# Patient Record
Sex: Male | Born: 1980 | Race: White | Hispanic: No | Marital: Single | Smoking: Never smoker
Health system: Southern US, Community
[De-identification: ages and names within clinical notes are randomized; demographics above are authoritative.]

## PROBLEM LIST (undated history)

## (undated) DIAGNOSIS — R161 Splenomegaly, not elsewhere classified: Secondary | ICD-10-CM

## (undated) DIAGNOSIS — I219 Acute myocardial infarction, unspecified: Secondary | ICD-10-CM

## (undated) DIAGNOSIS — E119 Type 2 diabetes mellitus without complications: Secondary | ICD-10-CM

## (undated) DIAGNOSIS — I2510 Atherosclerotic heart disease of native coronary artery without angina pectoris: Secondary | ICD-10-CM

## (undated) DIAGNOSIS — I10 Essential (primary) hypertension: Secondary | ICD-10-CM

## (undated) DIAGNOSIS — K760 Fatty (change of) liver, not elsewhere classified: Secondary | ICD-10-CM

## (undated) DIAGNOSIS — I700 Atherosclerosis of aorta: Secondary | ICD-10-CM

## (undated) HISTORY — DX: Splenomegaly, not elsewhere classified: R16.1

## (undated) HISTORY — PX: CARDIAC SURGERY: SHX584

## (undated) HISTORY — DX: Atherosclerosis of aorta: I70.0

## (undated) HISTORY — DX: Acute myocardial infarction, unspecified: I21.9

## (undated) HISTORY — DX: Fatty (change of) liver, not elsewhere classified: K76.0

---

## 2011-02-04 ENCOUNTER — Other Ambulatory Visit: Payer: Self-pay | Admitting: Family Medicine

## 2011-02-04 DIAGNOSIS — N50819 Testicular pain, unspecified: Secondary | ICD-10-CM

## 2011-02-05 ENCOUNTER — Ambulatory Visit (HOSPITAL_COMMUNITY)
Admission: RE | Admit: 2011-02-05 | Discharge: 2011-02-05 | Disposition: A | Discharge status: Home / Self Care | Payer: Self-pay | Source: Ambulatory Visit | Attending: Family Medicine | Admitting: Family Medicine

## 2011-02-05 DIAGNOSIS — N509 Disorder of male genital organs, unspecified: Secondary | ICD-10-CM | POA: Insufficient documentation

## 2011-02-05 DIAGNOSIS — N50819 Testicular pain, unspecified: Secondary | ICD-10-CM

## 2012-10-04 IMAGING — US US SCROTUM
1 series · 14 of 25 positions shown · non-contrast
Comparison: None.

CLINICAL DATA: Pain at left testicle.  Question mass or cyst.

ULTRASOUND OF SCROTUM
TECHNIQUE: Complete ultrasound examination of the testicles,
epididymis, and other scrotal structures was performed.

[Series 1: us scrotum · 0.08mm/px · 14 of 49 slices shown]
[im 1/49]
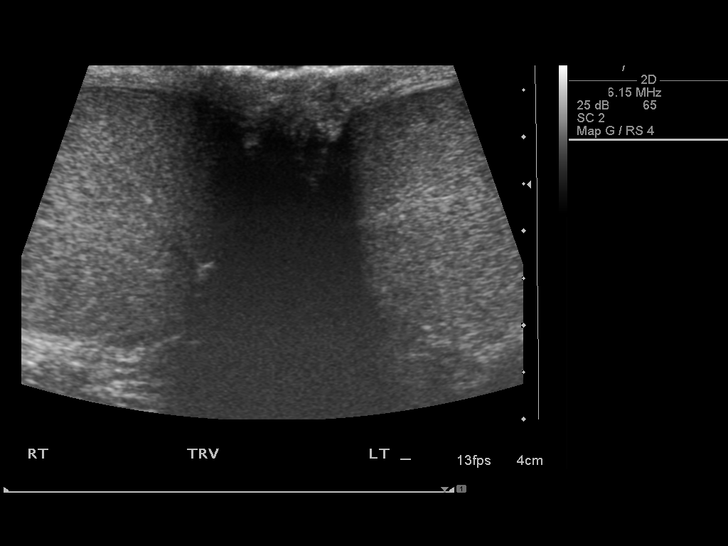
[im 5/49]
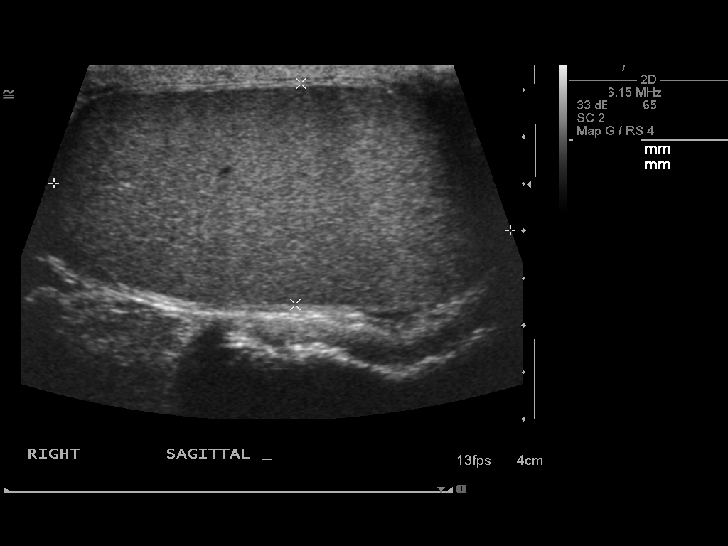
[im 9/49]
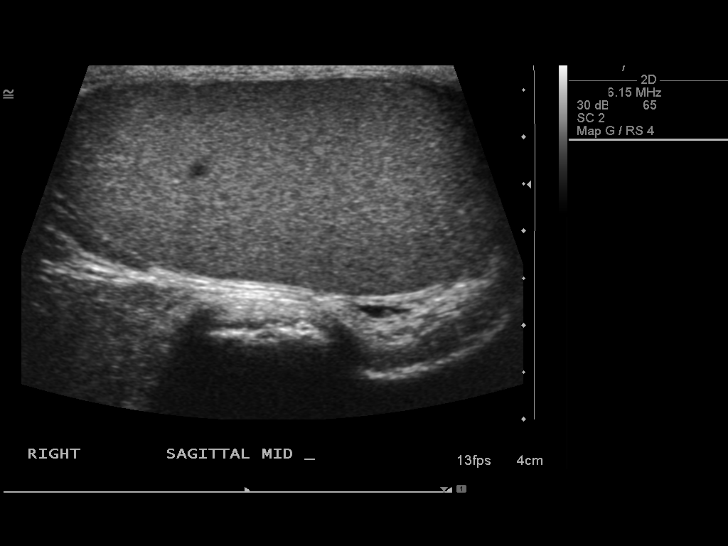
[im 13/49]
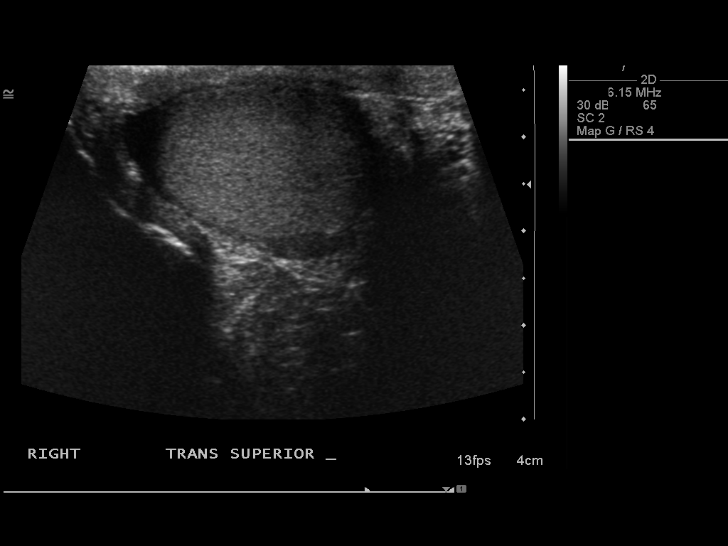
[im 17/49]
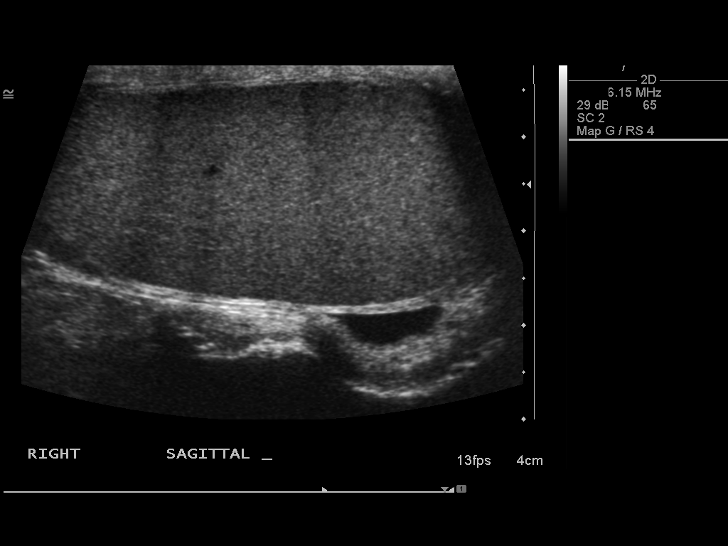
[im 19/49]
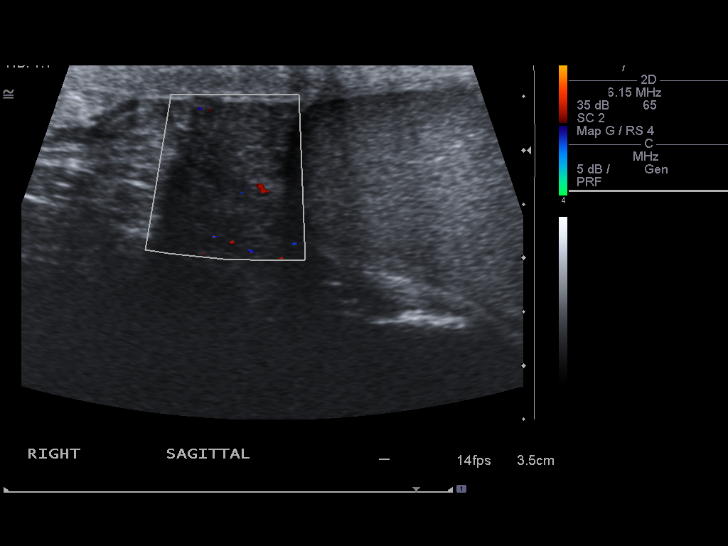
[im 23/49]
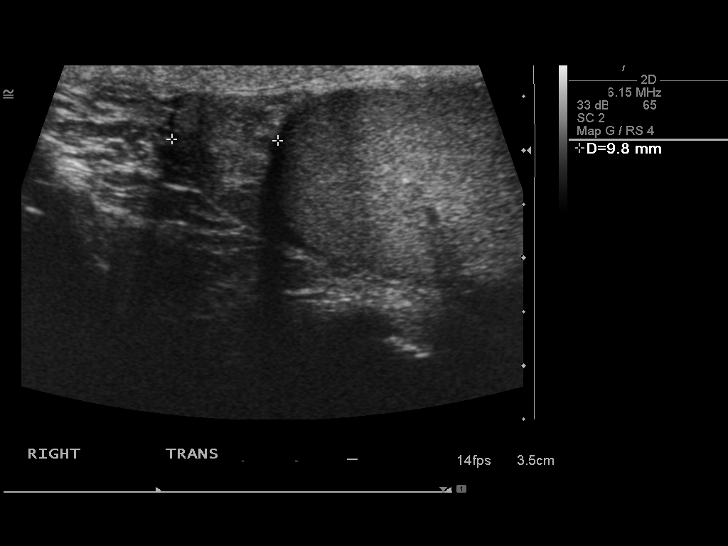
[im 27/49]
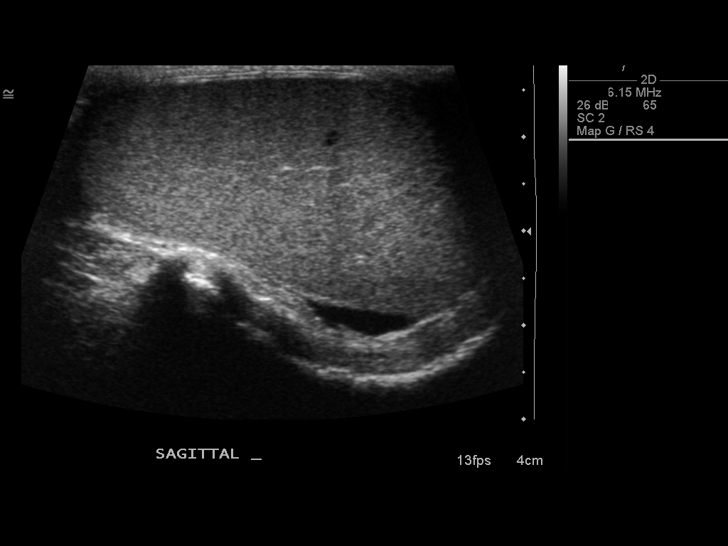
[im 31/49]
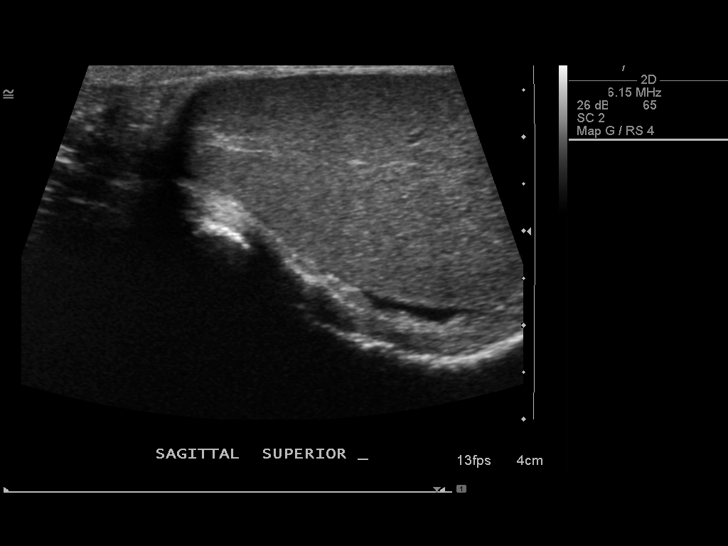
[im 33/49]
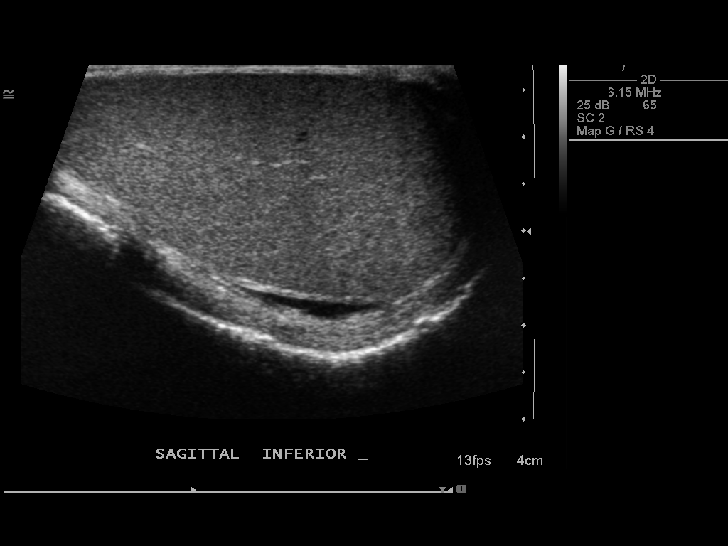
[im 37/49]
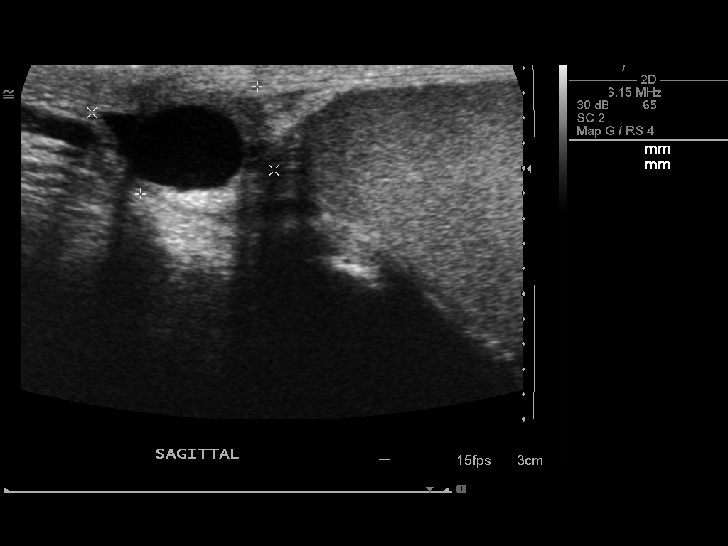
[im 41/49]
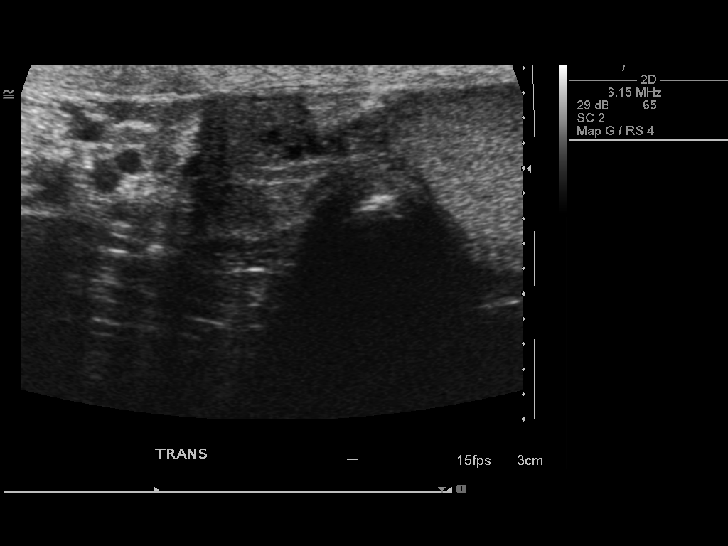
[im 45/49]
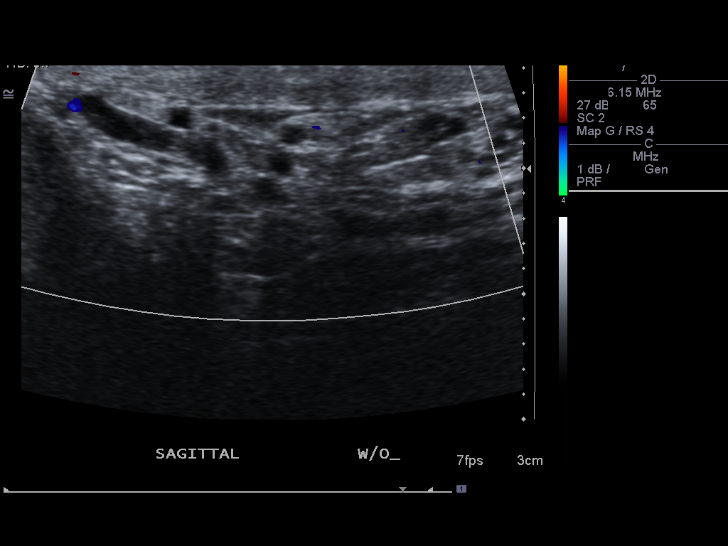
[im 49/49]
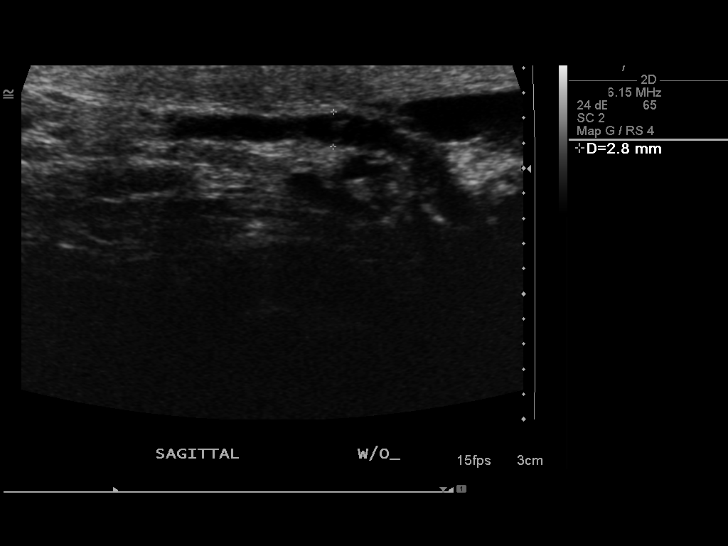

[14 of 25 positions shown; findings below may reference images not displayed]

FINDINGS: Right testis:  4.9 x 2.4 x 3.4 cm.  Normal in color signal.  Normal
in gray scale appearance.

Left testis:  4.7 x 2.4 x 1.9 cm.  Normal in gray scale and color
Doppler appearance.

Right epididymis:  Normal.

Left epididymis:  9 mm epididymal cyst versus spermatocele.

Hydrocele:  Absent.

Varicocele:  Equivocal.  Borderline enlarged left hemiscrotal veins
at up to 3 mm.
IMPRESSION: 1.  No evidence of testicular mass.
2.  Left epididymal cyst versus spermatocele.
3.  Borderline left sided varicocele.

## 2015-11-15 ENCOUNTER — Encounter (HOSPITAL_COMMUNITY): Payer: Self-pay | Admitting: Emergency Medicine

## 2015-11-15 ENCOUNTER — Emergency Department (HOSPITAL_COMMUNITY)
Admission: EM | Admit: 2015-11-15 | Discharge: 2015-11-15 | Disposition: A | Discharge status: Home / Self Care | Payer: Self-pay | Attending: Emergency Medicine | Admitting: Emergency Medicine

## 2015-11-15 DIAGNOSIS — K0889 Other specified disorders of teeth and supporting structures: Secondary | ICD-10-CM | POA: Insufficient documentation

## 2015-11-15 DIAGNOSIS — I10 Essential (primary) hypertension: Secondary | ICD-10-CM | POA: Insufficient documentation

## 2015-11-15 DIAGNOSIS — K029 Dental caries, unspecified: Secondary | ICD-10-CM | POA: Insufficient documentation

## 2015-11-15 HISTORY — DX: Essential (primary) hypertension: I10

## 2015-11-15 MED ORDER — PENICILLIN V POTASSIUM 250 MG PO TABS: 250.0000 mg | ORAL_TABLET | Freq: Four times a day (QID) | ORAL | Status: AC

## 2015-11-15 MED ORDER — OXYCODONE-ACETAMINOPHEN 5-325 MG PO TABS
1.0000 | ORAL_TABLET | Freq: Once | ORAL | Status: AC
  Administered 2015-11-15: 1 via ORAL

## 2015-11-15 MED ORDER — OXYCODONE-ACETAMINOPHEN 5-325 MG PO TABS
ORAL_TABLET | ORAL | Status: AC
  Filled 2015-11-15: qty 1

## 2015-11-15 NOTE — ED Notes (Signed)
Pt. reports left cheek pain with mild swelling onset this morning , denies injury , pt. stated broken tooth at left upper cheek . Hypertensive at triage .

## 2015-11-15 NOTE — Discharge Instructions (Signed)

## 2015-11-15 NOTE — ED Notes (Signed)
Patient able to ambulate independently  

## 2015-11-15 NOTE — ED Provider Notes (Signed)
CSN: [id number]     Arrival date & time 11/15/15  1946 History  By signing my name below, I, Placido SouLogan Joldersma, attest that this documentation has been prepared under the direction and in the presence of Roxy Horsemanobert Rickie Gutierres, PA-C. Electronically Signed: Placido SouLogan Joldersma, ED Scribe. 11/15/2015. 9:56 PM.    Chief Complaint  Patient presents with  . Facial Pain   The history is provided by the patient. No language interpreter was used.    HPI Comments: Joel Pierce is a 35 y.o. male who presents to the Emergency Department complaining of worsening, moderate, left upper dental pain onset this morning. He reports an associated, mild, point of swelling in the affected region. His pain worsens with palpation. He has an appointment with his dental provider in 8 days. He has no known drug allergies. Pt denies any other associated symptoms at this time.    Past Medical History  Diagnosis Date  . Hypertension    History reviewed. No pertinent past surgical history. No family history on file. Social History  Substance Use Topics  . Smoking status: Never Smoker   . Smokeless tobacco: None  . Alcohol Use: No    Review of Systems  Constitutional: Negative for fever and chills.  HENT: Positive for dental problem and facial swelling.     Allergies  Review of patient's allergies indicates no known allergies.  Home Medications   Prior to Admission medications   Not on File   BP 178/116 mmHg  Pulse 105  Temp(Src) 97.9 F (36.6 C) (Oral)  Resp 18  Ht 6' (1.829 m)  Wt 272 lb 8 oz (123.605 kg)  BMI 36.95 kg/m2  SpO2 97%    Physical Exam  Constitutional: Pt appears well-developed and well-nourished.  HENT:  Head: Normocephalic.  Right Ear: Tympanic membrane, external ear and ear canal normal.  Left Ear: Tympanic membrane, external ear and ear canal normal.  Nose: Nose normal. Right sinus exhibits no maxillary sinus tenderness and no frontal sinus tenderness. Left sinus exhibits no  maxillary sinus tenderness and no frontal sinus tenderness.  Mouth/Throat: Uvula is midline, oropharynx is clear and moist and mucous membranes are normal. No oral lesions. Abnormal dentition. Dental caries present. No uvula swelling or lacerations. No oropharyngeal exudate, posterior oropharyngeal edema, posterior oropharyngeal erythema or tonsillar abscesses. Aphthous ulcer is present . No gingival swelling, fluctuance or induration No gross abscess  Eyes: Conjunctivae are normal. Pupils are equal, round, and reactive to light. Right eye exhibits no discharge. Left eye exhibits no discharge.  Neck: Normal range of motion. Neck supple.  No stridor Handling secretions without difficulty No nuchal rigidity No cervical lymphadenopathy  Cardiovascular: Normal rate, regular rhythm and normal heart sounds.   Pulmonary/Chest: Effort normal. No respiratory distress.  Equal chest rise  Abdominal: Soft. Bowel sounds are normal. Pt exhibits no distension. There is no tenderness.  Lymphadenopathy:    Pt has no cervical adenopathy.  Neurological: Pt is alert.  Skin: Skin is warm and dry.  Psychiatric: Pt has a normal mood and affect.  Nursing note and vitals reviewed.   ED Course  Procedures  DIAGNOSTIC STUDIES: Oxygen Saturation is 97% on RA, normal by my interpretation.    COORDINATION OF CARE: 9:54 PM Discussed next steps with pt. He verbalized understanding and is agreeable with the plan.     MDM   Final diagnoses:  Pain, dental    Patient with dentalgia.  No abscess requiring immediate incision and drainage.  Exam not concerning for  Ludwig's angina or pharyngeal abscess.  Will treat with penicillin. Pt instructed to follow-up with dentist.  Discussed return precautions. Pt safe for discharge.   I personally performed the services described in this documentation, which was scribed in my presence. The recorded information has been reviewed and is accurate.      Roxy Horseman,  PA-C 11/15/15 2202  Linwood Dibbles, MD 11/16/15 [phone]

## 2015-11-15 NOTE — ED Notes (Signed)
PA at bedside.

## 2016-02-15 ENCOUNTER — Ambulatory Visit (HOSPITAL_COMMUNITY)
Admission: EM | Admit: 2016-02-15 | Discharge: 2016-02-15 | Disposition: A | Discharge status: Home / Self Care | Payer: Self-pay | Attending: Family Medicine | Admitting: Family Medicine

## 2016-02-15 ENCOUNTER — Encounter (HOSPITAL_COMMUNITY): Payer: Self-pay | Admitting: Nurse Practitioner

## 2016-02-15 DIAGNOSIS — L237 Allergic contact dermatitis due to plants, except food: Secondary | ICD-10-CM

## 2016-02-15 MED ORDER — METHYLPREDNISOLONE ACETATE 80 MG/ML IJ SUSP
INTRAMUSCULAR | Status: AC
  Filled 2016-02-15: qty 1

## 2016-02-15 MED ORDER — BETAMETHASONE DIPROPIONATE 0.05 % EX CREA: TOPICAL_CREAM | Freq: Two times a day (BID) | CUTANEOUS | Status: DC

## 2016-02-15 MED ORDER — METHYLPREDNISOLONE ACETATE 80 MG/ML IJ SUSP
80.0000 mg | Freq: Once | INTRAMUSCULAR | Status: AC
  Administered 2016-02-15: 80 mg via INTRAMUSCULAR

## 2016-02-15 NOTE — ED Notes (Signed)
Pt c/o 3 day history of itchy rash to bilateral arms. Onset after digging in dirt. He has tried several otc creams with no relief. He denies using any new products or medications. He is hypertensive today, he says that he does not take bp medication because of the side effects

## 2016-02-15 NOTE — ED Provider Notes (Signed)
CSN: [id number]     Arrival date & time 02/15/16  1259 History   First MD Initiated Contact with Patient 02/15/16 1344     Chief Complaint  Patient presents with  . Rash   (Consider location/radiation/quality/duration/timing/severity/associated sxs/prior Treatment) Patient is a 35 y.o. male presenting with rash. The history is provided by the patient.  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 Relieved by:  None tried Associated symptoms: no fever, no shortness of breath and not wheezing     Past Medical History  Diagnosis Date  . Hypertension    History reviewed. No pertinent past surgical history. History reviewed. No pertinent family history. Social History  Substance Use Topics  . Smoking status: Never Smoker   . Smokeless tobacco: None  . Alcohol Use: No    Review of Systems  Constitutional: Negative for fever.  Respiratory: Negative for shortness of breath and wheezing.   Musculoskeletal: Negative.   Skin: Positive for rash.  All other systems reviewed and are negative.   Allergies  Review of patient's allergies indicates no known allergies.  Home Medications   Prior to Admission medications   Medication Sig Start Date End Date Taking? Authorizing Provider  betamethasone dipropionate (DIPROLENE) 0.05 % cream Apply topically 2 (two) times daily. Spar[place] 02/15/16   Linna HoffJames D Abhishek Levesque, MD   Meds Ordered and Administered this Visit   Medications  methylPREDNISolone acetate (DEPO-MEDROL) injection 80 mg (not administered)    BP 176/129 mmHg  Pulse 80  Temp(Src) 98.5 F (36.9 C) (Oral)  Resp 20  SpO2 98% No data found.   Physical Exam  Constitutional: He appears well-developed and well-nourished.  No distress.  Skin: Skin is warm and dry. Rash noted.  Streaky patchy irreg p/v erythematous rash on volar forearms bilat.  Nursing note and vitals reviewed.   ED Course  Procedures (including critical care time)  Labs Review Labs Reviewed - No data to display  Imaging Review No results found.   Visual Acuity Review  Right Eye Distance:   Left Eye Distance:   Bilateral Distance:    Right Eye Near:   Left Eye Near:    Bilateral Near:         MDM   1. Contact dermatitis due to poison ivy        Linna HoffJames D Cochise Dinneen, MD 02/15/16 1409

## 2017-01-26 ENCOUNTER — Encounter (HOSPITAL_COMMUNITY): Payer: Self-pay | Admitting: Emergency Medicine

## 2017-01-26 ENCOUNTER — Ambulatory Visit (HOSPITAL_COMMUNITY)
Admission: EM | Admit: 2017-01-26 | Discharge: 2017-01-26 | Disposition: A | Discharge status: Home / Self Care | Payer: BLUE CROSS/BLUE SHIELD | Attending: Family Medicine | Admitting: Family Medicine

## 2017-01-26 DIAGNOSIS — H66012 Acute suppurative otitis media with spontaneous rupture of ear drum, left ear: Secondary | ICD-10-CM

## 2017-01-26 DIAGNOSIS — R05 Cough: Secondary | ICD-10-CM | POA: Diagnosis not present

## 2017-01-26 DIAGNOSIS — R059 Cough, unspecified: Secondary | ICD-10-CM

## 2017-01-26 MED ORDER — AMOXICILLIN 500 MG PO CAPS: 500.0000 mg | ORAL_CAPSULE | Freq: Three times a day (TID) | ORAL | 0 refills | Status: DC

## 2017-01-26 NOTE — ED Triage Notes (Signed)
[facility] and children with a complaint of a sore throat, cough and left ear pain.

## 2017-01-26 NOTE — ED Provider Notes (Signed)
CSN: [id number]     Arrival date & time 01/26/17  1336 History   First MD Initiated Contact with Patient 01/26/17 [phone]     Chief Complaint  Patient presents with  . Cough  . Sore Throat  . Otalgia   (Consider location/radiation/quality/duration/timing/severity/associated sxs/prior Treatment) Father states that he has had lt ear pain for the past 3 days . Put hydrogen peroxide in ear then heard a "pop" and the pain went away he just still feels the fluid like sound. Denies any n/v/d. Does have an intetrmit cough. Has not taken anything else.       Past Medical History:  Diagnosis Date  . Hypertension    History reviewed. No pertinent surgical history. History reviewed. No pertinent family history. Social History  Substance Use Topics  . Smoking status: Never Smoker  . Smokeless tobacco: Not on file  . Alcohol use No    Review of Systems  Constitutional: Negative.   HENT: Positive for ear pain and sore throat.   Eyes: Negative.   Respiratory: Positive for cough.   Cardiovascular: Negative.   Gastrointestinal: Negative.   Musculoskeletal: Negative.   Neurological: Negative.     Allergies  Patient has no known allergies.  Home Medications   Prior to Admission medications   Medication Sig Start Date End Date Taking? Authorizing Provider  amoxicillin (AMOXIL) 500 MG capsule Take 1 capsule (500 mg total) by mouth 3 (three) times daily. 01/26/17   Tobi BastosMitchell, Courtenay Creger A, NP   Meds Ordered and Administered this Visit  Medications - No data to display  BP (!) 117/112 (BP Location: Right Arm) Comment: cma notified  Pulse (!) 101 Comment: notified cma  Temp 98.9 F (37.2 C) (Oral)   Resp 16   SpO2 97%  No data found.   Physical Exam  Constitutional: He appears well-developed.  HENT:  Head: Normocephalic.  Right Ear: External ear normal.  Nose: Nose normal.  Mouth/Throat: Oropharynx is clear and moist.  LT TM ruptured, erythema,   Eyes: Pupils are equal, round, and  reactive to light.  Neck: Normal range of motion.  Cardiovascular: Normal rate and regular rhythm.   Pulmonary/Chest: Effort normal and breath sounds normal.  Abdominal: Soft. Bowel sounds are normal.  Musculoskeletal: Normal range of motion.  Neurological: He is alert.  Skin: Skin is warm.    Urgent Care Course     Procedures (including critical care time)  Labs Review Labs Reviewed - No data to display  Imaging Review No results found.           MDM   1. Cough   2. Acute suppurative otitis media of left ear with spontaneous rupture of tympanic membrane, recurrence not specified    Do not put anything in ears Take full dose of abx with food May use a humidifier at night to help, cough may linger for several weeks after treatment. May take tylenol or motrin as needed for pain      Tobi BastosMitchell, Essense Bousquet A, NP 01/26/17 1515

## 2019-07-19 ENCOUNTER — Other Ambulatory Visit: Payer: Self-pay

## 2019-07-19 DIAGNOSIS — Z20822 Contact with and (suspected) exposure to covid-19: Secondary | ICD-10-CM

## 2019-07-21 ENCOUNTER — Telehealth: Payer: Self-pay | Admitting: General Practice

## 2019-07-21 LAB — NOVEL CORONAVIRUS, NAA: SARS-CoV-2, NAA: NOT DETECTED

## 2019-07-21 NOTE — Telephone Encounter (Signed)
Patient is calling for his negative COVID results. Patient expressed understanding.

## 2021-09-01 ENCOUNTER — Ambulatory Visit
Admission: EM | Admit: 2021-09-01 | Discharge: 2021-09-01 | Disposition: A | Discharge status: Home / Self Care | Payer: 59

## 2021-09-01 ENCOUNTER — Other Ambulatory Visit: Payer: Self-pay

## 2021-09-01 DIAGNOSIS — J0110 Acute frontal sinusitis, unspecified: Secondary | ICD-10-CM

## 2021-09-01 MED ORDER — AMOXICILLIN-POT CLAVULANATE 875-125 MG PO TABS: 1.0000 | ORAL_TABLET | Freq: Two times a day (BID) | ORAL | 0 refills | Status: DC

## 2021-09-01 MED ORDER — ACETAMINOPHEN 325 MG PO TABS: 975.0000 mg | ORAL_TABLET | Freq: Once | ORAL | Status: DC

## 2021-09-01 MED ORDER — ACETAMINOPHEN 325 MG PO TABS
650.0000 mg | ORAL_TABLET | Freq: Once | ORAL | Status: AC
  Administered 2021-09-01: 650 mg via ORAL

## 2021-09-01 NOTE — Discharge Instructions (Addendum)
You have been prescribed antibiotic for upper respiratory infection.  Please monitor blood pressure at home and follow-up with primary care if it continues to be elevated.

## 2021-09-01 NOTE — ED Provider Notes (Signed)
[facility]    CSN: [id number] Arrival date & time: 09/01/21  1145      History   Chief Complaint Chief Complaint  Patient presents with   Facial Pain    HPI Joel Pierce is a 41 y.o. male.   Patient presents with headache, nasal congestion, sinus pressure that has been present for multiple days.  Patient reports that he has had the symptoms for over a week.  He also reports that his snot is bright yellow to green.  Denies any known fevers.  Denies chest pain, shortness of breath, sore throat, ear pain, nausea, vomiting, diarrhea, abdominal pain.  Patient has not taken any medications to help alleviate his symptoms.  He also has an elevated blood pressure today.  He reports that he does take blood pressure medication but skipped the dose yesterday because he would be drinking alcohol.  Denies chest pain, shortness of breath, blurry vision, dizziness, nausea, vomiting.  He does have a headache that he is attributing to his sinus pressure.    Past Medical History:  Diagnosis Date   Hypertension     There are no problems to display for this patient.   Past Surgical History:  Procedure Laterality Date   CARDIAC SURGERY         Home Medications    Prior to Admission medications   Medication Sig Start Date End Date Taking? Authorizing Provider  amoxicillin-clavulanate (AUGMENTIN) 875-125 MG tablet Take 1 tablet by mouth every 12 (twelve) hours. 09/01/21  Yes Gustavus Bryant, FNP  aspirin 81 MG chewable tablet Chew 1 tablet by mouth daily. 08/08/19  Yes [provider]  carvedilol (COREG) 12.5 MG tablet Take by mouth. 05/25/20  Yes [provider]  losartan (COZAAR) 100 MG tablet Take 1 tablet by mouth daily. 11/23/20  Yes [provider]  metFORMIN (GLUCOPHAGE-XR) 500 MG 24 hr tablet Take 2 tablets by mouth daily with breakfast. 08/27/20  Yes [provider]  finasteride (PROSCAR) 5 MG tablet Take by mouth.    [provider]    Family History History reviewed. No pertinent family history.  Social History Social History   Tobacco Use   Smoking status: Never  Vaping Use   Vaping Use: Every day   Substances: Nicotine, Flavoring  Substance Use Topics   Alcohol use: No   Drug use: Yes    Types: Marijuana    Comment: daily     Allergies   Patient has no known allergies.   Review of Systems Review of Systems Per HPI  Physical Exam Triage Vital Signs ED Triage Vitals  Enc Vitals Group     BP 09/01/21 1250 (!) 163/103     Pulse Rate 09/01/21 1250 84     Resp 09/01/21 1250 18     Temp 09/01/21 1250 97.9 F (36.6 C)     Temp Source 09/01/21 1250 Oral     SpO2 09/01/21 1250 97 %     Weight 09/01/21 1247 250 lb (113.4 kg)     Height 09/01/21 1247 5\' 10"  (1.778 m)     Head Circumference --      Peak Flow --      Pain Score 09/01/21 1247 6     Pain Loc --      Pain Edu? --      Excl. in GC? --    No data found.  Updated Vital Signs BP (!) 155/100    Pulse 84  Temp 97.9 F (36.6 C) (Oral)    Resp 18    Ht 5\' 10"  (1.778 m)    Wt 250 lb (113.4 kg)    SpO2 97%    BMI 35.87 kg/m   Visual Acuity Right Eye Distance:   Left Eye Distance:   Bilateral Distance:    Right Eye Near:   Left Eye Near:    Bilateral Near:     Physical Exam Constitutional:      General: He is not in acute distress.    Appearance: Normal appearance. He is not toxic-appearing or diaphoretic.  HENT:     Head: Normocephalic and atraumatic.     Right Ear: Tympanic membrane and ear canal normal.     Left Ear: Tympanic membrane and ear canal normal.     Nose: Congestion present.     Right Sinus: Frontal sinus tenderness present. No maxillary sinus tenderness.     Left Sinus: Frontal sinus tenderness present. No maxillary sinus tenderness.     Mouth/Throat:     Mouth: Mucous membranes are moist.     Pharynx: No posterior oropharyngeal erythema.  Eyes:     Extraocular Movements: Extraocular  movements intact.     Conjunctiva/sclera: Conjunctivae normal.     Pupils: Pupils are equal, round, and reactive to light.  Cardiovascular:     Rate and Rhythm: Normal rate and regular rhythm.     Pulses: Normal pulses.     Heart sounds: Normal heart sounds.  Pulmonary:     Effort: Pulmonary effort is normal. No respiratory distress.     Breath sounds: Normal breath sounds. No stridor. No wheezing, rhonchi or rales.  Abdominal:     General: Abdomen is flat. Bowel sounds are normal.     Palpations: Abdomen is soft.  Musculoskeletal:        General: Normal range of motion.     Cervical back: Normal range of motion.  Skin:    General: Skin is warm and dry.  Neurological:     General: No focal deficit present.     Mental Status: He is alert and oriented to person, place, and time. Mental status is at baseline.     Cranial Nerves: Cranial nerves 2-12 are intact.     Sensory: Sensation is intact.     Motor: Motor function is intact.     Coordination: Coordination is intact.     Gait: Gait is intact.  Psychiatric:        Mood and Affect: Mood normal.        Behavior: Behavior normal.     UC Treatments / Results  Labs (all labs ordered are listed, but only abnormal results are displayed) Labs Reviewed - No data to display  EKG   Radiology No results found.  Procedures Procedures (including critical care time)  Medications Ordered in UC Medications  acetaminophen (TYLENOL) tablet 975 mg (0 mg Oral Not Given 09/01/21 1255)  acetaminophen (TYLENOL) tablet 650 mg (650 mg Oral Given 09/01/21 1255)    Initial Impression / Assessment and Plan / UC Course  I have reviewed the triage vital signs and the nursing notes.  Pertinent labs & imaging results that were available during my care of the patient were reviewed by me and considered in my medical decision making (see chart for details).     Patient's physical exam appears consistent with acute sinusitis.  Will treat with  Augmentin antibiotic.  Do not think that imaging is necessary at this time.  Do not think that viral testing is necessary given duration of symptoms.  Patient also advised of symptomatic treatment.  Discussed strict return precautions.  Patient verbalized understanding and was agreeable with plan. Final Clinical Impressions(s) / UC Diagnoses   Final diagnoses:  Acute non-recurrent frontal sinusitis     Discharge Instructions      You have been prescribed antibiotic for upper respiratory infection.  Please monitor blood pressure at home and follow-up with primary care if it continues to be elevated.    ED Prescriptions     Medication Sig Dispense Auth. Provider   amoxicillin-clavulanate (AUGMENTIN) 875-125 MG tablet Take 1 tablet by mouth every 12 (twelve) hours. 14 tablet [place], Acie Fredrickson, [place]      PDMP not reviewed this encounter.   Gustavus Bryant, [place] 09/01/21 1329

## 2021-09-01 NOTE — ED Triage Notes (Signed)
Pt presents with HA and sinus pressure concerned for sinus infection  Pt st his snot is bright yellow/ green

## 2022-02-09 ENCOUNTER — Other Ambulatory Visit: Payer: Self-pay

## 2022-02-09 ENCOUNTER — Ambulatory Visit
Admission: EM | Admit: 2022-02-09 | Discharge: 2022-02-09 | Disposition: A | Discharge status: Home / Self Care | Payer: 59 | Attending: Emergency Medicine | Admitting: Emergency Medicine

## 2022-02-09 ENCOUNTER — Encounter: Payer: Self-pay | Admitting: Emergency Medicine

## 2022-02-09 DIAGNOSIS — K029 Dental caries, unspecified: Secondary | ICD-10-CM | POA: Diagnosis not present

## 2022-02-09 DIAGNOSIS — K047 Periapical abscess without sinus: Secondary | ICD-10-CM | POA: Diagnosis not present

## 2022-02-09 HISTORY — DX: Type 2 diabetes mellitus without complications: E11.9

## 2022-02-09 HISTORY — DX: Atherosclerotic heart disease of native coronary artery without angina pectoris: I25.10

## 2022-02-09 MED ORDER — PENICILLIN V POTASSIUM 500 MG PO TABS: 500.0000 mg | ORAL_TABLET | Freq: Three times a day (TID) | ORAL | 0 refills | Status: AC

## 2022-02-09 NOTE — ED Provider Notes (Signed)
[facility]    CSN: [id number] Arrival date & time: 02/09/22  1554    HISTORY   Chief Complaint  Patient presents with   Dental Pain   HPI Joel Pierce is a 41 y.o. male. Patient presents urgent care today complaining of significant pain in the left upper molar for the past 2 days.  Patient states this is an issue that he has been "kicking down the field" for several months.   Dental Pain Quality:  Constant Severity:  Moderate Onset quality:  Gradual Duration:  1 month Timing:  Constant Progression:  Worsening Context: dental caries   Relieved by:  None tried Worsened by:  Cold food/drink, hot food/drink, touching, jaw movement and pressure Associated symptoms: no congestion, no difficulty swallowing, no drooling, no facial pain, no facial swelling, no fever, no gum swelling, no headaches, no neck pain, no neck swelling, no oral bleeding, no oral lesions and no trismus    Past Medical History:  Diagnosis Date   Coronary artery disease    Diabetes mellitus without complication (HCC)    Hypertension    There are no problems to display for this patient.  Past Surgical History:  Procedure Laterality Date   CARDIAC SURGERY      Home Medications    Prior to Admission medications   Medication Sig Start Date End Date Taking? Authorizing Provider  aspirin 81 MG chewable tablet Chew 1 tablet by mouth daily. 08/08/19   [provider]  carvedilol (COREG) 12.5 MG tablet Take by mouth. 05/25/20   [provider]  finasteride (PROSCAR) 5 MG tablet Take by mouth.    [provider]  losartan (COZAAR) 100 MG tablet Take 1 tablet by mouth daily. 11/23/20   [provider]  metFORMIN (GLUCOPHAGE-XR) 500 MG 24 hr tablet Take 2 tablets by mouth daily with breakfast. 08/27/20   [provider]    Family History History reviewed. No pertinent family history. Social History Social History   Tobacco Use   Smoking status:  Never  Vaping Use   Vaping Use: Every day   Substances: Nicotine, Flavoring  Substance Use Topics   Alcohol use: No   Drug use: Yes    Types: Marijuana    Comment: daily   Allergies   Patient has no known allergies.  Review of Systems Review of Systems  Constitutional:  Negative for fever.  HENT:  Negative for congestion, drooling, facial swelling and mouth sores.   Musculoskeletal:  Negative for neck pain.  Neurological:  Negative for headaches.   Pertinent findings noted in history of present illness.   Physical Exam Triage Vital Signs ED Triage Vitals  Enc Vitals Group     BP 06/28/21 0827 (!) 147/82     Pulse Rate 06/28/21 0827 72     Resp 06/28/21 0827 18     Temp 06/28/21 0827 98.3 F (36.8 C)     Temp Source 06/28/21 0827 Oral     SpO2 06/28/21 0827 98 %     Weight --      Height --      Head Circumference --      Peak Flow --      Pain Score 06/28/21 0826 5     Pain Loc --      Pain Edu? --      Excl. in GC? --   No data found.  Updated Vital Signs BP (!) 183/114 (BP Location: Left Arm)   Pulse 85  Temp 98.1 F (36.7 C) (Oral)   Resp 18   SpO2 95%   Physical Exam Vitals and nursing note reviewed.  Constitutional:      General: He is not in acute distress.    Appearance: Normal appearance. He is not ill-appearing.  HENT:     Head: Normocephalic and atraumatic.     Salivary Glands: Right salivary gland is not diffusely enlarged or tender. Left salivary gland is not diffusely enlarged or tender.     Right Ear: Tympanic membrane, ear canal and external ear normal. No drainage. No middle ear effusion. There is no impacted cerumen. Tympanic membrane is not erythematous or bulging.     Left Ear: Tympanic membrane, ear canal and external ear normal. No drainage.  No middle ear effusion. There is no impacted cerumen. Tympanic membrane is not erythematous or bulging.     Nose: Nose normal. No nasal deformity, septal deviation, mucosal edema, congestion or  rhinorrhea.     Right Turbinates: Not enlarged, swollen or pale.     Left Turbinates: Not enlarged, swollen or pale.     Right Sinus: No maxillary sinus tenderness or frontal sinus tenderness.     Left Sinus: No maxillary sinus tenderness or frontal sinus tenderness.     Mouth/Throat:     Lips: Pink. No lesions.     Mouth: Mucous membranes are moist. No oral lesions.     Dentition: Dental tenderness and dental caries present. No gum lesions.     Pharynx: Oropharynx is clear. Uvula midline. No posterior oropharyngeal erythema or uvula swelling.     Tonsils: No tonsillar exudate. 0 on the right. 0 on the left.  Eyes:     General: Lids are normal.        Right eye: No discharge.        Left eye: No discharge.     Extraocular Movements: Extraocular movements intact.     Conjunctiva/sclera: Conjunctivae normal.     Right eye: Right conjunctiva is not injected.     Left eye: Left conjunctiva is not injected.  Neck:     Trachea: Trachea and phonation normal.  Cardiovascular:     Rate and Rhythm: Normal rate and regular rhythm.     Pulses: Normal pulses.     Heart sounds: Normal heart sounds. No murmur heard.    No friction rub. No gallop.  Pulmonary:     Effort: Pulmonary effort is normal. No accessory muscle usage, prolonged expiration or respiratory distress.     Breath sounds: Normal breath sounds. No stridor, decreased air movement or transmitted upper airway sounds. No decreased breath sounds, wheezing, rhonchi or rales.  Chest:     Chest wall: No tenderness.  Musculoskeletal:        General: Normal range of motion.     Cervical back: Normal range of motion and neck supple. Normal range of motion.  Lymphadenopathy:     Cervical: No cervical adenopathy.  Skin:    General: Skin is warm and dry.     Findings: No erythema or rash.  Neurological:     General: No focal deficit present.     Mental Status: He is alert and oriented to person, place, and time.  Psychiatric:        Mood  and Affect: Mood normal.        Behavior: Behavior normal.     Visual Acuity Right Eye Distance:   Left Eye Distance:   Bilateral Distance:    Right  Eye Near:   Left Eye Near:    Bilateral Near:     UC Couse / Diagnostics / Procedures:    EKG  Radiology No results found.  Procedures Procedures (including critical care time)  UC Diagnoses / Final Clinical Impressions(s)   I have reviewed the triage vital signs and the nursing notes.  Pertinent labs & imaging results that were available during my care of the patient were reviewed by me and considered in my medical decision making (see chart for details).    Final diagnoses:  Pain due to dental caries  Dental infection   Patient advised to follow-up with his dentist and begin the penicillin 3 times daily for 14 days.  ED Prescriptions     Medication Sig Dispense Auth. Provider   penicillin v potassium (VEETID) 500 MG tablet Take 1 tablet (500 mg total) by mouth 3 (three) times daily for 14 days. 42 tablet Lynden Oxford Scales, PA-C      PDMP not reviewed this encounter.  Pending results:  Labs Reviewed - No data to display  Medications Ordered in UC: Medications - No data to display  Disposition Upon Discharge:  Condition: stable for discharge home Home: take medications as prescribed; routine discharge instructions as discussed; follow up as advised.  Patient presented with an acute illness with associated systemic symptoms and significant discomfort requiring urgent management. In my opinion, this is a condition that a prudent lay person (someone who possesses an average knowledge of health and medicine) may potentially expect to result in complications if not addressed urgently such as respiratory distress, impairment of bodily function or dysfunction of bodily organs.   Routine symptom specific, illness specific and/or disease specific instructions were discussed with the patient and/or caregiver at length.    As such, the patient has been evaluated and assessed, work-up was performed and treatment was provided in alignment with urgent care protocols and evidence based medicine.  Patient/parent/caregiver has been advised that the patient may require follow up for further testing and treatment if the symptoms continue in spite of treatment, as clinically indicated and appropriate.  Patient/parent/caregiver has been advised to return to the [facility] or PCP if no better; to PCP or the Emergency Department if new signs and symptoms develop, or if the current signs or symptoms continue to change or worsen for further workup, evaluation and treatment as clinically indicated and appropriate  The patient will follow up with their current PCP if and as advised. If the patient does not currently have a PCP we will assist them in obtaining one.   The patient may need specialty follow up if the symptoms continue, in spite of conservative treatment and management, for further workup, evaluation, consultation and treatment as clinically indicated and appropriate.   Patient/parent/caregiver verbalized understanding and agreement of plan as discussed.  All questions were addressed during visit.  Please see discharge instructions below for further details of plan.  Discharge Instructions:   Discharge Instructions      Please begin penicillin 3 times daily for a full 14 days.  Please see your dentist as soon as possible to repair the injured tooth.  Thank you for visiting urgent care today.    This office note has been dictated using Museum/gallery curator.  Unfortunately, and despite my best efforts, this method of dictation can sometimes lead to occasional typographical or grammatical errors.  I apologize in advance if this occurs.     Lynden Oxford Scales, [place] 02/10/22 [phone]

## 2022-02-09 NOTE — ED Triage Notes (Signed)
Pt here for left upper dental pain x 2 days

## 2022-02-09 NOTE — Discharge Instructions (Signed)
Please begin penicillin 3 times daily for a full 14 days.  Please see your dentist as soon as possible to repair the injured tooth.  Thank you for visiting urgent care today.

## 2022-07-02 DIAGNOSIS — Z419 Encounter for procedure for purposes other than remedying health state, unspecified: Secondary | ICD-10-CM | POA: Diagnosis not present

## 2022-08-01 DIAGNOSIS — Z419 Encounter for procedure for purposes other than remedying health state, unspecified: Secondary | ICD-10-CM | POA: Diagnosis not present

## 2022-08-13 ENCOUNTER — Emergency Department (HOSPITAL_BASED_OUTPATIENT_CLINIC_OR_DEPARTMENT_OTHER): Payer: 59

## 2022-08-13 ENCOUNTER — Other Ambulatory Visit: Payer: Self-pay

## 2022-08-13 ENCOUNTER — Emergency Department (HOSPITAL_BASED_OUTPATIENT_CLINIC_OR_DEPARTMENT_OTHER)
Admission: EM | Admit: 2022-08-13 | Discharge: 2022-08-13 | Disposition: A | Discharge status: Home / Self Care | Payer: 59 | Attending: Emergency Medicine | Admitting: Emergency Medicine

## 2022-08-13 ENCOUNTER — Encounter (HOSPITAL_BASED_OUTPATIENT_CLINIC_OR_DEPARTMENT_OTHER): Payer: Self-pay

## 2022-08-13 ENCOUNTER — Ambulatory Visit: Admission: EM | Admit: 2022-08-13 | Discharge: 2022-08-13 | Payer: 59

## 2022-08-13 DIAGNOSIS — K7460 Unspecified cirrhosis of liver: Secondary | ICD-10-CM | POA: Insufficient documentation

## 2022-08-13 DIAGNOSIS — I2510 Atherosclerotic heart disease of native coronary artery without angina pectoris: Secondary | ICD-10-CM | POA: Diagnosis not present

## 2022-08-13 DIAGNOSIS — I10 Essential (primary) hypertension: Secondary | ICD-10-CM | POA: Insufficient documentation

## 2022-08-13 DIAGNOSIS — R161 Splenomegaly, not elsewhere classified: Secondary | ICD-10-CM | POA: Diagnosis not present

## 2022-08-13 DIAGNOSIS — R109 Unspecified abdominal pain: Secondary | ICD-10-CM

## 2022-08-13 DIAGNOSIS — E119 Type 2 diabetes mellitus without complications: Secondary | ICD-10-CM | POA: Insufficient documentation

## 2022-08-13 LAB — COMPREHENSIVE METABOLIC PANEL
ALT: 61 U/L — ABNORMAL HIGH (ref 0–44)
AST: 30 U/L (ref 15–41)
Albumin: 3.8 g/dL (ref 3.5–5.0)
Alkaline Phosphatase: 71 U/L (ref 38–126)
Anion gap: 5 (ref 5–15)
BUN: 11 mg/dL (ref 6–20)
CO2: 27 mmol/L (ref 22–32)
Calcium: 8.7 mg/dL — ABNORMAL LOW (ref 8.9–10.3)
Chloride: 107 mmol/L (ref 98–111)
Creatinine, Ser: 0.91 mg/dL (ref 0.61–1.24)
GFR, Estimated: >60 mL/min (ref >60)
Glucose, Bld: 144 mg/dL — ABNORMAL HIGH (ref 70–99)
Potassium: 3.6 mmol/L (ref 3.5–5.1)
Sodium: 139 mmol/L (ref 135–145)
Total Bilirubin: 0.5 mg/dL (ref 0.3–1.2)
Total Protein: 6.9 g/dL (ref 6.5–8.1)

## 2022-08-13 LAB — CBC WITH DIFFERENTIAL/PLATELET
Abs Immature Granulocytes: 0.02 10*3/uL (ref 0.00–0.07)
Basophils Absolute: 0 10*3/uL (ref 0.0–0.1)
Basophils Relative: 0 %
Eosinophils Absolute: 0.1 10*3/uL (ref 0.0–0.5)
Eosinophils Relative: 2 %
HCT: 42.7 % (ref 39.0–52.0)
Hemoglobin: 15.1 g/dL (ref 13.0–17.0)
Immature Granulocytes: 0 %
Lymphocytes Relative: 30 %
Lymphs Abs: 2.2 10*3/uL (ref 0.7–4.0)
MCH: 29.5 pg (ref 26.0–34.0)
MCHC: 35.4 g/dL (ref 30.0–36.0)
MCV: 83.4 fL (ref 80.0–100.0)
Monocytes Absolute: 0.5 10*3/uL (ref 0.1–1.0)
Monocytes Relative: 6 %
Neutro Abs: 4.5 10*3/uL (ref 1.7–7.7)
Neutrophils Relative %: 62 %
Platelets: 154 10*3/uL (ref 150–400)
RBC: 5.12 MIL/uL (ref 4.22–5.81)
RDW: 13.2 % (ref 11.5–15.5)
WBC: 7.3 10*3/uL (ref 4.0–10.5)
nRBC: 0 % (ref 0.0–0.2)

## 2022-08-13 LAB — TROPONIN I (HIGH SENSITIVITY): Troponin I (High Sensitivity): 5 ng/L (ref <18)

## 2022-08-13 LAB — URINALYSIS, ROUTINE W REFLEX MICROSCOPIC
Bilirubin Urine: NEGATIVE
Glucose, UA: NEGATIVE mg/dL
Ketones, ur: NEGATIVE mg/dL
Leukocytes,Ua: NEGATIVE
Nitrite: NEGATIVE
Protein, ur: NEGATIVE mg/dL
Specific Gravity, Urine: 1.01 (ref 1.005–1.030)
pH: 6.5 (ref 5.0–8.0)

## 2022-08-13 LAB — URINALYSIS, MICROSCOPIC (REFLEX)

## 2022-08-13 LAB — D-DIMER, QUANTITATIVE: D-Dimer, Quant: <0.27 ug/mL-FEU (ref 0.00–0.50)

## 2022-08-13 NOTE — ED Triage Notes (Signed)
Pt does report he stopped taking all of his prescribed medication a year and a half ago

## 2022-08-13 NOTE — ED Triage Notes (Addendum)
Pt presents with R side thoracic back pain when trying to sleep on his R side. Pt reports R side CP, breast tenderness. Pt states he thinks he feels and enlarged lymph node behind R pectoralis.  Denies ShOB. Pt has recently recovered from viral illness.

## 2022-08-13 NOTE — ED Triage Notes (Signed)
Pt c/o back pain gnawing at night when trying to sleep on his favorite side located on right side of ribs. Onset ~ 5 days ago

## 2022-08-13 NOTE — Discharge Instructions (Addendum)
It was a pleasure taking care of you today.  As discussed, your labs are reassuring.  Your CT scan showed cirrhosis of the liver and enlarged spleen.  Also showed some inflammation in the intestines.  I have included the number of the GI doctor.  Please call to schedule an appointment for further evaluation.  Continue to take over-the-counter pain medication as needed for pain.  Be careful with Tylenol as it goes through the liver.  I have also included the number of [facility].  Call to schedule appointment to establish care.  Return to the ER for any worsening symptoms.

## 2022-08-13 NOTE — ED Provider Notes (Signed)
[facility] EMERGENCY DEPARTMENT Provider Note   CSN: [id number] Arrival date & time: 08/13/22  1604     History  Chief Complaint  Patient presents with   Back Pain    Joel Pierce is a 41 y.o. male with a past medical history significant for hypertension, diabetes, and CAD who presents to the ED due to right flank/rib pain x 4 to 5 days.  Pain worse when lying on side.  No history of kidney stones.  No recent injury to area however, had an injury roughly 1 year ago to area.  No urinary symptoms.  No rash.  Patient also concerned about a 60 pound weight loss over the past 6 months which was unintended. No fever or night sweats. Patient was evaluated by urgent care prior to arrival and sent to the ED for further evaluation.  He admits to a nodule on the right side of his chest wall that sometimes radiates to right breast causing pain. Unsure how long this as been there for.  Patient is currently chest pain-free.  No shortness of breath.  Denies lower extremity edema.  History obtained from patient and past medical records. No interpreter used during encounter.       Home Medications Prior to Admission medications   Not on File      Allergies    Patient has no known allergies.    Review of Systems   Review of Systems  Constitutional:  Negative for chills and fever.  Respiratory:  Negative for shortness of breath.   Cardiovascular:  Positive for chest pain (right side ribs).  Gastrointestinal:  Negative for abdominal pain, diarrhea, nausea and vomiting.  Genitourinary:  Positive for flank pain. Negative for dysuria.  All other systems reviewed and are negative.   Physical Exam Updated Vital Signs BP (!) 155/103   Pulse 78   Temp 98.2 F (36.8 C) (Oral)   Resp 16   Ht 5\' 11"  (1.803 m)   Wt 97.5 kg   SpO2 100%   BMI 29.99 kg/m  Physical Exam Vitals and nursing note reviewed.  Constitutional:      General: He is not in acute distress.    Appearance:  He is not ill-appearing.  HENT:     Head: Normocephalic.  Eyes:     Pupils: Pupils are equal, round, and reactive to light.  Cardiovascular:     Rate and Rhythm: Normal rate and regular rhythm.     Pulses: Normal pulses.     Heart sounds: Normal heart sounds. No murmur heard.    No friction rub. No gallop.  Pulmonary:     Effort: Pulmonary effort is normal.     Breath sounds: Normal breath sounds.  Abdominal:     General: Abdomen is flat. There is no distension.     Palpations: Abdomen is soft.     Tenderness: There is no abdominal tenderness. There is no guarding or rebound.     Comments: No tenderness to right flank region.  Abdomen soft, nondistended, nontender.  Musculoskeletal:        General: Normal range of motion.     Cervical back: Neck supple.     Comments: No lower extremity edema.  Skin:    General: Skin is warm and dry.  Neurological:     General: No focal deficit present.     Mental Status: He is alert.  Psychiatric:        Mood and Affect: Mood normal.  Behavior: Behavior normal.     ED Results / Procedures / Treatments   Labs (all labs ordered are listed, but only abnormal results are displayed) Labs Reviewed  COMPREHENSIVE METABOLIC PANEL - Abnormal; Notable for the following components:      Result Value   Glucose, Bld 144 (*)    Calcium 8.7 (*)    ALT 61 (*)    All other components within normal limits  URINALYSIS, ROUTINE W REFLEX MICROSCOPIC - Abnormal; Notable for the following components:   Hgb urine dipstick TRACE (*)    All other components within normal limits  URINALYSIS, MICROSCOPIC (REFLEX) - Abnormal; Notable for the following components:   Bacteria, UA RARE (*)    All other components within normal limits  CBC WITH DIFFERENTIAL/PLATELET  D-DIMER, QUANTITATIVE  HEPATITIS PANEL, ACUTE  TROPONIN I (HIGH SENSITIVITY)    EKG EKG Interpretation  Date/Time:  Wednesday August 13 2022 16:43:08 EST Ventricular Rate:  82 PR  Interval:  154 QRS Duration: 88 QT Interval:  364 QTC Calculation: 425 R Axis:   89 Text Interpretation: Normal sinus rhythm Normal ECG No previous ECGs available no prior ECG for comparison. No STEMI Confirmed by Theda Belfast ([id number]) on 08/13/2022 5:10:07 PM  Radiology CT Renal Stone Study  Result Date: 08/13/2022 CLINICAL DATA:  Abdominal and flank pain EXAM: CT ABDOMEN AND PELVIS WITHOUT CONTRAST TECHNIQUE: Multidetector CT imaging of the abdomen and pelvis was performed following the standard protocol without IV contrast. Unenhanced CT was performed per clinician order. Lack of IV contrast limits sensitivity and specificity, especially for evaluation of abdominal/pelvic solid viscera. RADIATION DOSE REDUCTION: This exam was performed according to the departmental dose-optimization program which includes automated exposure control, adjustment of the mA and/or kV according to patient size and/or use of iterative reconstruction technique. COMPARISON:  None Available. FINDINGS: Lower chest: No acute pleural or parenchymal lung disease. Hepatobiliary: Heterogeneous decreased attenuation of the liver parenchyma most consistent with hepatic steatosis. Hypertrophy of the left lobe liver is well as nodular contour of the liver capsule consistent with cirrhosis. Unremarkable unenhanced appearance of the gallbladder. No biliary duct dilation. Pancreas: Unremarkable unenhanced appearance. Spleen: The spleen is enlarged measuring 16.5 x 14.8 x 7.6 cm. No focal abnormalities on this unenhanced exam. Adrenals/Urinary Tract: No urinary tract calculi or obstructive uropathy within either kidney. The adrenals and bladder are unremarkable. Stomach/Bowel: No bowel obstruction or ileus. Normal appendix right lower quadrant. There is mild wall thickening of the proximal jejunum, which could reflect underlying inflammatory or infectious enteritis. Vascular/Lymphatic: Aortic atherosclerosis. Scattered subcentimeter lymph  nodes throughout the upper abdominal mesentery likely reactive. No pathologic adenopathy. Reproductive: Prostate is unremarkable. Other: No free fluid or free intraperitoneal gas. No abdominal wall hernia. Musculoskeletal: No acute or destructive bony lesions. Reconstructed images demonstrate no additional findings. IMPRESSION: 1. Proximal jejunal wall thickening, which could reflect underlying inflammatory or infectious enteritis. 2. No urinary tract calculi or obstructive uropathy. 3. Appearance of the liver compatible with cirrhosis. 4. Splenomegaly, which could reflect underlying portal venous hypertension. 5.  Aortic Atherosclerosis (ICD10-I70.0). Electronically Signed   By: Sharlet Salina M.D.   On: 08/13/2022 21:21   DG Chest 2 View  Result Date: 08/13/2022 CLINICAL DATA:  Chest pain. EXAM: CHEST - 2 VIEW COMPARISON:  None Available. FINDINGS: The cardiomediastinal contours are normal. The lungs are clear. Pulmonary vasculature is normal. No consolidation, pleural effusion, or pneumothorax. No acute osseous abnormalities are seen. IMPRESSION: Negative radiographs of the chest. Electronically Signed   By: Shawna Orleans  Sanford M.D.   On: 08/13/2022 19:26    Procedures Procedures    Medications Ordered in ED Medications - No data to display  ED Course/ Medical Decision Making/ A&P                           Medical Decision Making Amount and/or Complexity of Data Reviewed External Data Reviewed: notes.    Details: Cardiology note about nstemi Labs: ordered. Decision-making details documented in ED Course. Radiology: ordered and independent interpretation performed. ECG/medicine tests: ordered and independent interpretation performed. Decision-making details documented in ED Course.   This patient presents to the ED for concern of flank/chest pain, this involves an extensive number of treatment options, and is a complaint that carries with it a high risk of complications and morbidity.  The  differential diagnosis includes kidney stone, PE, MSK etiology, pneumonia, etc  40 year old male presents to the ED due to right flank/rib pain x 4 to 5 days worse when lying on his side.  No history of blood clots. Previous NSETMI. Denies lower extremity edema.  No history of kidney stones.  Patient also has had a 60 pound weight loss over the past 6 months which was unintended.  Patient advised by urgent care to report to the ED for further evaluation.  Upon arrival, patient afebrile, not tachycardic or hypoxic.  Patient in no acute distress.  Reassuring physical exam.  No rash to suggest shingles.  No CVA tenderness.  Abdomen soft, nondistended, nontender.  No right upper quadrant tenderness to suggest acute cholecystitis or pancreatitis.  Routine labs ordered.  Given location of pain, will obtain D-dimer to rule out PE.   CBC unremarkable.  No leukocytosis and normal hemoglobin.  CMP significant for hyperglycemia 144.  No anion gap.  Doubt DKA.  Normal renal function.  No major electrolyte derangements.  UA with trace hematuria.  D-dimer normal.  Doubt PE.  Troponin normal.  EKG demonstrates normal sinus rhythm.  No signs of acute ischemia.  Low suspicion for ACS.  Chest x-ray personally reviewed and interpreted which was negative for signs of pneumonia, pneumothorax, wide mediastinum.  CT renal study demonstrates cirrhosis, splenomegaly, and proximal jejunal wall thickening.  Unsure etiology of findings.  Patient denies alcohol use.  He denies any history of hepatitis or IV drug use.  Hepatitis panel pending.  Patient discharged with GI referral and advised to call tomorrow to schedule appointment for further evaluation.  No evidence of acute liver failure.  Patient made aware of results.  Patient stable for discharge. Strict ED precautions discussed with patient. Patient states understanding and agrees to plan. Patient discharged home in no acute distress and stable vitals         Final Clinical  Impression(s) / ED Diagnoses Final diagnoses:  Flank pain  Cirrhosis of liver without ascites, unspecified hepatic cirrhosis type (HCC)  Splenomegaly    Rx / DC Orders ED Discharge Orders          Ordered    Ambulatory referral to Gastroenterology        08/13/22 2226              Jesusita Oka 08/13/22 2236    Tegeler, Canary Brim, MD 08/13/22 [phone]

## 2022-08-13 NOTE — ED Provider Notes (Signed)
Patient here today for evaluation of right-sided flank pain that is been ongoing for the last week.  He reports that symptoms are worse when he lies on his right side at night.  He is concerned because he has lost a significant amount of weight in the last 6 months without trying.  He does not report fever or night sweats.  Denies any blood lesions.  With weight loss and associated back pain that has become persistent recommended further evaluation in the emergency room for stat labs and imaging.  Patient expresses understanding and is agreeable to same.   Tomi Bamberger, PA-C 08/13/22 1503    Tomi Bamberger, PA-C 08/14/22 1327

## 2022-08-13 NOTE — ED Notes (Signed)
Patient is being discharged from the Urgent Care and sent to the Emergency Department via personal vehicle . Per Provider Erma Pinto, patient is in need of higher level of care due to unknown significant weight loss. Patient is aware and verbalizes understanding of plan of care.  Vitals:   08/13/22 1342  BP: (!) 164/97  Pulse: 75  Resp: 16  Temp: 98.2 F (36.8 C)  SpO2: 98%

## 2022-08-14 ENCOUNTER — Telehealth: Payer: Self-pay

## 2022-08-14 LAB — HEPATITIS PANEL, ACUTE
HCV Ab: NONREACTIVE
Hep A IgM: NONREACTIVE
Hep B C IgM: NONREACTIVE
Hepatitis B Surface Ag: NONREACTIVE

## 2022-08-14 NOTE — Patient Outreach (Signed)
Transition Care Management Follow-up Telephone Call Date of discharge and from where: 08/13/22 [facility] How have you been since you were released from the hospital? Feeling a little better Any questions or concerns? Yes  Items Reviewed: Did the pt receive and understand the discharge instructions provided? Yes  Medications obtained and verified? Yes  Other? No  Any new allergies since your discharge? No  Dietary orders reviewed? No Do you have support at home? Yes   Home Care and Equipment/Supplies: Were home health services ordered? not applicable If so, what is the name of the agency?   Has the agency set up a time to come to the patient's home? not applicable Were any new equipment or medical supplies ordered?  No What is the name of the medical supply agency?  Were you able to get the supplies/equipment? not applicable Do you have any questions related to the use of the equipment or supplies? No  Functional Questionnaire: (I = Independent and D = Dependent) ADLs: I  Bathing/Dressing- I  Meal Prep- I  Eating- I  Maintaining continence- I  Transferring/Ambulation- I  Managing Meds- I  Follow up appointments reviewed:  PCP Hospital f/u appt confirmed? No  Scheduled to see  on  @ . Specialist Hospital f/u appt confirmed? Yes  Scheduled to see GI Danis on 08/18/22 @ 8:40. Are transportation arrangements needed? No  If their condition worsens, is the pt aware to call PCP or go to the Emergency Dept.? Yes Was the patient provided with contact information for the PCP's office or ED? Yes Was to pt encouraged to call back with questions or concerns? Yes Gus Puma, BSW, [facility]  [facility]  High Risk Managed Medicaid Team  [phone]

## 2022-08-18 ENCOUNTER — Encounter: Payer: Self-pay | Admitting: Gastroenterology

## 2022-08-18 ENCOUNTER — Ambulatory Visit: Payer: 59 | Admitting: Gastroenterology

## 2022-08-18 ENCOUNTER — Other Ambulatory Visit (INDEPENDENT_AMBULATORY_CARE_PROVIDER_SITE_OTHER): Payer: 59

## 2022-08-18 VITALS — BP 144/78 | HR 72

## 2022-08-18 DIAGNOSIS — R634 Abnormal weight loss: Secondary | ICD-10-CM

## 2022-08-18 DIAGNOSIS — R933 Abnormal findings on diagnostic imaging of other parts of digestive tract: Secondary | ICD-10-CM | POA: Diagnosis not present

## 2022-08-18 DIAGNOSIS — K7460 Unspecified cirrhosis of liver: Secondary | ICD-10-CM | POA: Diagnosis not present

## 2022-08-18 LAB — PROTIME-INR
INR: 1 ratio (ref 0.8–1.0)
Prothrombin Time: 11.4 s (ref 9.6–13.1)

## 2022-08-18 LAB — IBC + FERRITIN
Ferritin: 60.6 ng/mL (ref 22.0–322.0)
Iron: 87 ug/dL (ref 42–165)
Saturation Ratios: 27.6 % (ref 20.0–50.0)
TIBC: 315 ug/dL (ref 250.0–450.0)
Transferrin: 225 mg/dL (ref 212.0–360.0)

## 2022-08-18 LAB — TSH: TSH: 1.28 u[IU]/mL (ref 0.35–5.50)

## 2022-08-18 NOTE — Progress Notes (Addendum)
[facility] Consult Note:  History: Joel Pierce 08/18/2022  Referring provider: Patient, No Pcp Per  Reason for consult/chief complaint: Cirrhosis (Had back dull back ache every time lays down, had 60 lbs in 6 months, never had good BM, gas.)   Subjective  HPI: This is a 41 year old man referred by: ED provider after a visit on 08/13/2022 for several days of right-sided flank pain worse laying on that side.  Clinical details in that provider's note.  Workup negative for PE, ACS, renal stone or other clear etiology.  CT findings reported changes of cirrhosis with splenomegaly and some abnormality of the small bowel.  He had also reported an approximate 60 pound unintentional weight loss over the course of this year.  [name] says he had a dull right flank/lower back pain worse when he lays on that side during sleep, going on for about a week prior to the ED visit and still present but to a lesser degree.  He has intermittent constipation, denies diarrhea or rectal bleeding.  About 6 months ago he stopped all of his medications including aspirin, metformin, losartan and carvedilol (as of the meds he can recall), and after that said his overall feeling of health and wellbeing significantly improved and has continued since then.  States that he has felt the best he has in many years and thinks his medicines were making him feel unwell.  That roughly coincides with the timing of him losing weight, and says although he has not dieting and was intending to lose weight, and this was more than he expected and he had never previously been successful in weight loss.  He is scheduled with a new primary care provider (due to insurance changes), and has a visit in early February since that was the next new patient appointment available.  He denies nausea or vomiting, known family history of liver disease, and says that he was diagnosed years ago with fatty liver.  Denies alcohol or  illicit drug use.   ROS:  Review of Systems  Constitutional:  Negative for appetite change and unexpected weight change.  HENT:  Negative for mouth sores and voice change.   Eyes:  Negative for pain and redness.  Respiratory:  Negative for cough and shortness of breath.   Cardiovascular:  Negative for chest pain and palpitations.  Genitourinary:  Negative for dysuria and hematuria.  Musculoskeletal:  Positive for back pain. Negative for arthralgias and myalgias.  Skin:  Negative for pallor and rash.  Neurological:  Negative for weakness and headaches.  Hematological:  Negative for adenopathy.     Past Medical History: Past Medical History:  Diagnosis Date   Aortic atherosclerosis (HCC)    Coronary artery disease    Diabetes mellitus without complication (HCC)    Fatty liver    Heart attack (HCC)    Hypertension    Splenomegaly    Last primary care visit appears to be 2021. He had a telemedicine cardiology visit in January 2022 with [facility]  Past Surgical History: Past Surgical History:  Procedure Laterality Date   CARDIAC SURGERY     He reports having had 2 coronary stents, and prior primary care records indicate he was on Plavix at one point after that.  Family History: Family History  Problem Relation Age of Onset   Kidney disease Mother    Heart disease Mother    Kidney Stones Mother     Social History: Social History   Socioeconomic History  Marital status: Single    Spouse name: Not on file   Number of children: Not on file   Years of education: Not on file   Highest education level: Not on file  Occupational History   Not on file  Tobacco Use   Smoking status: Never   Smokeless tobacco: Never  Vaping Use   Vaping Use: Every day   Substances: Nicotine, Flavoring  Substance and Sexual Activity   Alcohol use: Yes    Comment: rarely   Drug use: Yes    Types: Marijuana    Comment: daily   Sexual activity: Not on file  Other Topics Concern    Not on file  Social History Narrative   Not on file   Social Determinants of Health   Financial Resource Strain: Low Risk  (08/14/2022)   Overall Financial Resource Strain (CARDIA)    Difficulty of Paying Living Expenses: Not very hard  Food Insecurity: No Food Insecurity (08/14/2022)   Hunger Vital Sign    Worried About Running Out of Food in the Last Year: Never true    Ran Out of Food in the Last Year: Never true  Transportation Needs: No Transportation Needs (08/14/2022)   PRAPARE - Administrator, Civil Service (Medical): No    Lack of Transportation (Non-Medical): No  Physical Activity: Not on file  Stress: Not on file  Social Connections: Not on file    Allergies: No Known Allergies  Outpatient Meds: No current outpatient medications on file.   No current facility-administered medications for this visit.      ___________________________________________________________________ Objective   Exam:  BP (!) 144/78   Pulse 72  Wt Readings from Last 3 Encounters:  08/13/22 215 lb (97.5 kg)  09/01/21 250 lb (113.4 kg)  11/15/15 272 lb 8 oz (123.6 kg)   Note 35 pound weight loss in the last year as above  General: He is well-appearing, pleasant and conversational.  Ambulatory, gets on exam table easily. Eyes: sclera anicteric, no redness ENT: oral mucosa moist without lesions, no cervical or supraclavicular lymphadenopathy CV: Regular without murmur, no JVD, no peripheral edema Resp: clear to auscultation bilaterally, normal RR and effort noted GI: soft, no bruit, no tenderness, with active bowel sounds.  Left lobe liver edge just felt on deep inspiration, right lobe not palpated, no spleen tip palpable.  No bulging flanks distention Skin; warm and dry, no rash or jaundice noted Neuro: awake, alert and oriented x 3. Normal gross motor function and fluent speech  Labs:     Latest Ref Rng & Units 08/13/2022    4:48 PM  CBC  WBC 4.0 - 10.5 K/uL  7.3   Hemoglobin 13.0 - 17.0 g/dL 53.6   Hematocrit 14.4 - 52.0 % 42.7   Platelets 150 - 400 K/uL 154       Latest Ref Rng & Units 08/13/2022    4:48 PM  CMP  Glucose 70 - 99 mg/dL 315   BUN 6 - 20 mg/dL 11   Creatinine 4.00 - 1.24 mg/dL 8.67   Sodium 619 - 509 mmol/L 139   Potassium 3.5 - 5.1 mmol/L 3.6   Chloride 98 - 111 mmol/L 107   CO2 22 - 32 mmol/L 27   Calcium 8.9 - 10.3 mg/dL 8.7   Total Protein 6.5 - 8.1 g/dL 6.9   Total Bilirubin 0.3 - 1.2 mg/dL 0.5   Alkaline Phos 38 - 126 U/L 71   AST 15 - 41 U/L  30   ALT 0 - 44 U/L 61    Albumin 3.8  Acute viral Hep panel negative  Radiologic Studies:  CLINICAL DATA:  Abdominal and flank pain   EXAM: CT ABDOMEN AND PELVIS WITHOUT CONTRAST   TECHNIQUE: Multidetector CT imaging of the abdomen and pelvis was performed following the standard protocol without IV contrast. Unenhanced CT was performed per clinician order. Lack of IV contrast limits sensitivity and specificity, especially for evaluation of abdominal/pelvic solid viscera.   RADIATION DOSE REDUCTION: This exam was performed according to the departmental dose-optimization program which includes automated exposure control, adjustment of the mA and/or kV according to patient size and/or use of iterative reconstruction technique.   COMPARISON:  None Available.   FINDINGS: Lower chest: No acute pleural or parenchymal lung disease.   Hepatobiliary: Heterogeneous decreased attenuation of the liver parenchyma most consistent with hepatic steatosis. Hypertrophy of the left lobe liver is well as nodular contour of the liver capsule consistent with cirrhosis. Unremarkable unenhanced appearance of the gallbladder. No biliary duct dilation.   Pancreas: Unremarkable unenhanced appearance.   Spleen: The spleen is enlarged measuring 16.5 x 14.8 x 7.6 cm. No focal abnormalities on this unenhanced exam.   Adrenals/Urinary Tract: No urinary tract calculi or  obstructive uropathy within either kidney. The adrenals and bladder are unremarkable.   Stomach/Bowel: No bowel obstruction or ileus. Normal appendix right lower quadrant. There is mild wall thickening of the proximal jejunum, which could reflect underlying inflammatory or infectious enteritis.   Vascular/Lymphatic: Aortic atherosclerosis. Scattered subcentimeter lymph nodes throughout the upper abdominal mesentery likely reactive. No pathologic adenopathy.   Reproductive: Prostate is unremarkable.   Other: No free fluid or free intraperitoneal gas. No abdominal wall hernia.   Musculoskeletal: No acute or destructive bony lesions. Reconstructed images demonstrate no additional findings.   IMPRESSION: 1. Proximal jejunal wall thickening, which could reflect underlying inflammatory or infectious enteritis. 2. No urinary tract calculi or obstructive uropathy. 3. Appearance of the liver compatible with cirrhosis. 4. Splenomegaly, which could reflect underlying portal venous hypertension. 5.  Aortic Atherosclerosis (ICD10-I70.0).     Electronically Signed   By: Sharlet Salina M.D.   On: 08/13/2022 21:21  Assessment: Encounter Diagnoses  Name Primary?   Cirrhosis of liver without ascites, unspecified hepatic cirrhosis type (HCC) Yes   Abnormal finding on GI tract imaging    Weight loss     New diagnosis of cirrhosis on imaging, no reports of GI bleeding, no clinical signs of encephalopathy, no ascites on the noncontrast scan. We discussed many possible infectious, metabolic and autoimmune liver conditions as well as fatty liver.  He has reported prior diagnosis of fatty liver that may have progressed to the cirrhosis we are now seeing.  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  Questionably abnormal small bowel on CT scan.  The small bowel reported wall thickening, though nonspecific and may be artifact  on this noncontrast study.  He does not have chronic diarrhea or abdominal pain to suggest Crohn's or neoplasm.  Weight loss of unclear cause, seems unlikely to have occurred after stopping the medicines he reported.  He was making diet changes but lost more than he expected. He unquestionably needs to be seen by primary care for reevaluation of his cardiovascular disease and diabetes.  He really should be on at least aspirin, but he seems disinclined to do that at this point.  Plan:  Extensive serologic workup for cirrhosis was ordered  today.  CT scan abdomen pelvis with oral and IV contrast (with and without IV) for better evaluation of the jejunum and liver.  Follow-up with me afterwards to discuss findings and need for future screening upper endoscopy to look for varices.  Establish with primary care for management of underlying medical issues.  Thank you for the courtesy of this consult.  Please call me with any questions or concerns.  Charlie PitterHenry L Danis III  CC: Referring provider noted above

## 2022-08-18 NOTE — Patient Instructions (Addendum)
Your provider has requested that you go to the basement level for lab work before leaving today. Press "B" on the elevator. The lab is located at the first door on the left as you exit the elevator. ______________________________________________________  Joel Pierce have been scheduled for a CT scan of the abdomen and pelvis at [facility], 1st floor Radiology. You are scheduled on 08/26/22 at 9:00 am. You should arrive 30 minutes prior to your appointment time for registration.  Please follow the written instructions below on the day of your exam:   1) Do not eat anything after 5:00 am (4 hours prior to your test)   You may take any medications as prescribed with a small amount of water, if necessary. If you take any of the following medications: METFORMIN, GLUCOPHAGE, [place], AVANDAMET, RIOMET, FORTAMET, [place] MET, JANUMET, GLUMETZA or METAGLIP, you MAY be asked to HOLD this medication 48 hours AFTER the exam.    If you have any questions regarding your exam or if you need to reschedule, you may call [facility] at [phone] between the hours of 8:00 am and 5:00 pm, Monday-Friday.   _________________________________________________________  If you are age 25 or older, your body mass index should be between 23-30. Your There is no height or weight on file to calculate BMI. If this is out of the aforementioned range listed, please consider follow up with your Primary Care Provider.  If you are age 68 or younger, your body mass index should be between 19-25. Your There is no height or weight on file to calculate BMI. If this is out of the aformentioned range listed, please consider follow up with your Primary Care Provider.   ________________________________________________________  The [facility] providers would like to encourage you to use [facility] to communicate with providers for non-urgent requests or questions.  Due to long hold times on the telephone, sending your  provider a message by [facility] may be a faster and more efficient way to get a response.  Please allow 48 business hours for a response.  Please remember that this is for non-urgent requests.  _______________________________________________________  Due to recent changes in healthcare laws, you may see the results of your imaging and laboratory studies on MyChart before your provider has had a chance to review them.  We understand that in some cases there may be results that are confusing or concerning to you. Not all laboratory results come back in the same time frame and the provider may be waiting for multiple results in order to interpret others.  Please give [place] 48 hours in order for your provider to thoroughly review all the results before contacting the office for clarification of your results.

## 2022-08-20 LAB — AFP TUMOR MARKER: AFP-Tumor Marker: 2.7 ng/mL (ref <6.1)

## 2022-08-20 LAB — MITOCHONDRIAL ANTIBODIES: Mitochondrial M2 Ab, IgG: <=20 U (ref <=20.0)

## 2022-08-20 LAB — TISSUE TRANSGLUTAMINASE, IGA: (tTG) Ab, IgA: <1 U/mL

## 2022-08-20 LAB — ANTI-SMOOTH MUSCLE ANTIBODY, IGG: Actin (Smooth Muscle) Antibody (IGG): <20 U (ref <20)

## 2022-08-20 LAB — CERULOPLASMIN: Ceruloplasmin: 28 mg/dL (ref 18–36)

## 2022-08-20 LAB — IGA: Immunoglobulin A: 247 mg/dL (ref 47–310)

## 2022-08-20 LAB — ANTI-NUCLEAR AB-TITER (ANA TITER): ANA Titer 1: 1:80 {titer} — ABNORMAL HIGH

## 2022-08-20 LAB — ANA: Anti Nuclear Antibody (ANA): POSITIVE — AB

## 2022-08-23 ENCOUNTER — Emergency Department (HOSPITAL_COMMUNITY): Admission: EM | Admit: 2022-08-23 | Discharge: 2022-08-23 | Discharge status: Against Medical Advice | Payer: 59

## 2022-08-23 ENCOUNTER — Other Ambulatory Visit: Payer: Self-pay

## 2022-08-23 ENCOUNTER — Emergency Department (HOSPITAL_COMMUNITY): Payer: 59

## 2022-08-23 ENCOUNTER — Emergency Department (HOSPITAL_COMMUNITY)
Admission: EM | Admit: 2022-08-23 | Discharge: 2022-08-23 | Discharge status: Against Medical Advice | Payer: 59 | Attending: Emergency Medicine | Admitting: Emergency Medicine

## 2022-08-23 DIAGNOSIS — M79601 Pain in right arm: Secondary | ICD-10-CM

## 2022-08-23 DIAGNOSIS — Z5321 Procedure and treatment not carried out due to patient leaving prior to being seen by health care provider: Secondary | ICD-10-CM | POA: Insufficient documentation

## 2022-08-23 DIAGNOSIS — M79602 Pain in left arm: Secondary | ICD-10-CM | POA: Diagnosis not present

## 2022-08-23 LAB — CBC
HCT: 45.1 % (ref 39.0–52.0)
Hemoglobin: 15.5 g/dL (ref 13.0–17.0)
MCH: 29.3 pg (ref 26.0–34.0)
MCHC: 34.4 g/dL (ref 30.0–36.0)
MCV: 85.3 fL (ref 80.0–100.0)
Platelets: 148 10*3/uL — ABNORMAL LOW (ref 150–400)
RBC: 5.29 MIL/uL (ref 4.22–5.81)
RDW: 13.2 % (ref 11.5–15.5)
WBC: 7 10*3/uL (ref 4.0–10.5)
nRBC: 0 % (ref 0.0–0.2)

## 2022-08-23 LAB — URINALYSIS, ROUTINE W REFLEX MICROSCOPIC
Bilirubin Urine: NEGATIVE
Glucose, UA: NEGATIVE mg/dL
Ketones, ur: 5 mg/dL — AB
Leukocytes,Ua: NEGATIVE
Nitrite: NEGATIVE
Protein, ur: NEGATIVE mg/dL
Specific Gravity, Urine: 1.008 (ref 1.005–1.030)
pH: 6 (ref 5.0–8.0)

## 2022-08-23 LAB — BASIC METABOLIC PANEL
Anion gap: 6 (ref 5–15)
BUN: 12 mg/dL (ref 6–20)
CO2: 25 mmol/L (ref 22–32)
Calcium: 9.1 mg/dL (ref 8.9–10.3)
Chloride: 109 mmol/L (ref 98–111)
Creatinine, Ser: 0.92 mg/dL (ref 0.61–1.24)
GFR, Estimated: >60 mL/min (ref >60)
Glucose, Bld: 110 mg/dL — ABNORMAL HIGH (ref 70–99)
Potassium: 3.7 mmol/L (ref 3.5–5.1)
Sodium: 140 mmol/L (ref 135–145)

## 2022-08-23 LAB — TROPONIN I (HIGH SENSITIVITY): Troponin I (High Sensitivity): 6 ng/L (ref <18)

## 2022-08-23 NOTE — ED Notes (Signed)
Pt LWBS 

## 2022-08-23 NOTE — ED Triage Notes (Signed)
Patient coming to ED for evaluation of R arm pain.  States pain has been intermittent.  Worse in lower arm and c/o sensation changes.  Pain worse when straightening arm and radiates to R chest.

## 2022-08-26 ENCOUNTER — Ambulatory Visit (HOSPITAL_COMMUNITY): Payer: 59

## 2022-08-27 ENCOUNTER — Emergency Department (HOSPITAL_COMMUNITY): Payer: 59

## 2022-08-27 ENCOUNTER — Encounter (HOSPITAL_COMMUNITY): Payer: Self-pay | Admitting: Emergency Medicine

## 2022-08-27 ENCOUNTER — Other Ambulatory Visit: Payer: Self-pay

## 2022-08-27 ENCOUNTER — Emergency Department (HOSPITAL_COMMUNITY)
Admission: EM | Admit: 2022-08-27 | Discharge: 2022-08-27 | Disposition: A | Discharge status: Home / Self Care | Payer: 59 | Attending: Emergency Medicine | Admitting: Emergency Medicine

## 2022-08-27 DIAGNOSIS — I10 Essential (primary) hypertension: Secondary | ICD-10-CM | POA: Insufficient documentation

## 2022-08-27 DIAGNOSIS — K7460 Unspecified cirrhosis of liver: Secondary | ICD-10-CM | POA: Insufficient documentation

## 2022-08-27 DIAGNOSIS — K5790 Diverticulosis of intestine, part unspecified, without perforation or abscess without bleeding: Secondary | ICD-10-CM | POA: Insufficient documentation

## 2022-08-27 DIAGNOSIS — F419 Anxiety disorder, unspecified: Secondary | ICD-10-CM | POA: Insufficient documentation

## 2022-08-27 DIAGNOSIS — E119 Type 2 diabetes mellitus without complications: Secondary | ICD-10-CM | POA: Diagnosis not present

## 2022-08-27 DIAGNOSIS — Z7982 Long term (current) use of aspirin: Secondary | ICD-10-CM | POA: Insufficient documentation

## 2022-08-27 DIAGNOSIS — K625 Hemorrhage of anus and rectum: Secondary | ICD-10-CM | POA: Diagnosis present

## 2022-08-27 LAB — COMPREHENSIVE METABOLIC PANEL
ALT: 35 U/L (ref 0–44)
AST: 23 U/L (ref 15–41)
Albumin: 3.7 g/dL (ref 3.5–5.0)
Alkaline Phosphatase: 52 U/L (ref 38–126)
Anion gap: 5 (ref 5–15)
BUN: 7 mg/dL (ref 6–20)
CO2: 27 mmol/L (ref 22–32)
Calcium: 8.8 mg/dL — ABNORMAL LOW (ref 8.9–10.3)
Chloride: 107 mmol/L (ref 98–111)
Creatinine, Ser: 0.91 mg/dL (ref 0.61–1.24)
GFR, Estimated: >60 mL/min (ref >60)
Glucose, Bld: 100 mg/dL — ABNORMAL HIGH (ref 70–99)
Potassium: 4.1 mmol/L (ref 3.5–5.1)
Sodium: 139 mmol/L (ref 135–145)
Total Bilirubin: 0.9 mg/dL (ref 0.3–1.2)
Total Protein: 6.3 g/dL — ABNORMAL LOW (ref 6.5–8.1)

## 2022-08-27 LAB — CBC
HCT: 41.2 % (ref 39.0–52.0)
Hemoglobin: 14.2 g/dL (ref 13.0–17.0)
MCH: 29.5 pg (ref 26.0–34.0)
MCHC: 34.5 g/dL (ref 30.0–36.0)
MCV: 85.5 fL (ref 80.0–100.0)
Platelets: 120 10*3/uL — ABNORMAL LOW (ref 150–400)
RBC: 4.82 MIL/uL (ref 4.22–5.81)
RDW: 13 % (ref 11.5–15.5)
WBC: 6.1 10*3/uL (ref 4.0–10.5)
nRBC: 0 % (ref 0.0–0.2)

## 2022-08-27 LAB — ABO/RH: ABO/RH(D): A POS

## 2022-08-27 LAB — TYPE AND SCREEN
ABO/RH(D): A POS
Antibody Screen: NEGATIVE

## 2022-08-27 MED ORDER — IOHEXOL 350 MG/ML SOLN
100.0000 mL | Freq: Once | INTRAVENOUS | Status: AC | PRN
  Administered 2022-08-27: 100 mL via INTRAVENOUS

## 2022-08-27 NOTE — ED Triage Notes (Signed)
Presents from home via POV tarry stools and some bright red blood in stools x1 day. Endorses lower abd discomfort.  Denies dizziness Recently diagnosed with enlarged spleen and cirrhosis Never had a colonoscopy or endoscopy

## 2022-08-27 NOTE — Discharge Instructions (Addendum)
You came to the emergency department today with dark stools.  Your blood counts are normal.  We did the CT scan that your gastroenterologist was going to order outpatient.  It showed the same liver cirrhosis and some diverticulosis but no other acute findings.  I have attached information about diverticulosis to these discharge papers.  It can cause blood in your stool however more likely bright red blood and dark.  Please continue to follow with Dr. Myrtie Neither with GI.  Return with any worsening symptoms.  It was a pleasure to meet you and we hope you feel better!

## 2022-08-27 NOTE — ED Provider Notes (Signed)
[facility] EMERGENCY DEPARTMENT Provider Note   CSN: [id number] Arrival date & time: 08/27/22  2703     History  Chief Complaint  Patient presents with   Rectal Bleeding    Joel Pierce is a 41 y.o. male presenting today with melena.  He reports that he woke up this morning had a bowel movement and it was completely black.  When he wiped he noted tarry consistency as well.  He says that he is used to some hematochezia due to his hemorrhoids but has never had dark stools.  Denies alcohol use but does report that he was recently told that he has liver cirrhosis.  Takes a baby aspirin other day but no other NSAIDs.   Rectal Bleeding Associated symptoms: no abdominal pain and no vomiting        Home Medications Prior to Admission medications   Not on File      Allergies    Patient has no known allergies.    Review of Systems   Review of Systems  Gastrointestinal:  Positive for anal bleeding, blood in stool and hematochezia. Negative for abdominal pain, constipation, diarrhea, nausea, rectal pain and vomiting.    Physical Exam Updated Vital Signs BP (!) 143/98 (BP Location: Left Arm)   Pulse 69   Temp 98.5 F (36.9 C)   Resp 16   Wt 97.5 kg   SpO2 99%   BMI 29.99 kg/m  Physical Exam Vitals and nursing note reviewed.  Constitutional:      Appearance: Normal appearance.  HENT:     Head: Normocephalic and atraumatic.  Eyes:     General: No scleral icterus.    Conjunctiva/sclera: Conjunctivae normal.  Pulmonary:     Effort: Pulmonary effort is normal. No respiratory distress.  Abdominal:     General: Abdomen is flat.     Palpations: Abdomen is soft.     Tenderness: There is no abdominal tenderness.  Skin:    Findings: No rash.  Neurological:     Mental Status: He is alert.  Psychiatric:        Mood and Affect: Mood normal.     Comments: anxious     ED Results / Procedures / Treatments   Labs (all labs ordered are listed, but  only abnormal results are displayed) Labs Reviewed  COMPREHENSIVE METABOLIC PANEL - Abnormal; Notable for the following components:      Result Value   Glucose, Bld 100 (*)    Calcium 8.8 (*)    Total Protein 6.3 (*)    All other components within normal limits  CBC - Abnormal; Notable for the following components:   Platelets 120 (*)    All other components within normal limits  HEMOGLOBIN AND HEMATOCRIT, BLOOD  POC OCCULT BLOOD, ED  TYPE AND SCREEN  ABO/RH    EKG None  Radiology No results found.  Procedures Procedures   Medications Ordered in ED Medications - No data to display  ED Course/ Medical Decision Making/ A&P                           Medical Decision Making Amount and/or Complexity of Data Reviewed Labs: ordered. Radiology: ordered.  Risk Prescription drug management.   41 year old male presenting today with melena.  Differential includes but is not limited to PUD, gastritis, varices, medication reaction,  this is not an exhaustive differential.    Past Medical History / Co-morbidities / Social History:  Type 2 diabetes, hypertension   Additional history: Per chart review patient had a CT renal 2 weeks ago showed some hepatic steatosis as well as left liver lobe cirrhosis.   Physical Exam: Pertinent physical exam findings include No severe abdominal tenderness  Lab Tests: I ordered, and personally interpreted labs.  The pertinent results include: Normal liver enzymes Thrombocytopenia to 120-2/2 cirrhosis?  148 4 days ago   Imaging Studies: Consider discharge home for outpatient imaging however patient missed his appointment with GI today.  I spoke with radiology tech, Caryl Pina, who helped decide the most appropriate scan for patient's presentation today that would also encompass the outpatient scan that he missed.  CT revealing of liver cirrhosis and diverticulosis, no other findings.    Medications: Hemoglobin stable, none necessary at  this time    MDM/Disposition: This is a 41 year old male who presented today with melena.  1 episode this morning.  History of hemorrhoids but no history of UGI bleed.  Follows with GI and is supposed to have an outpatient CAT scan.  He missed his appointment so this CAT scan was ordered today.  There were no acute findings, the liver cirrhosis is known and he has diverticulosis.  We discussed what diverticulosis is and that he should not be alarmed without signs of diverticulitis.  He is ambulatory in the department.  Hemoglobin is stable, does not require admission for GI bleed.  He is agreeable to following up outpatient.  Return precautions discussed and he was given information about melena and diverticulosis   Final Clinical Impression(s) / ED Diagnoses Final diagnoses:  Diverticulosis  Hepatic cirrhosis, unspecified hepatic cirrhosis type, unspecified whether ascites present [facility])    Rx / DC Orders ED Discharge Orders          Ordered    Ambulatory referral to Gastroenterology        08/27/22 1502           Results and diagnoses were explained to the patient. Return precautions discussed in full. Patient had no additional questions and expressed complete understanding.   This chart was dictated using voice recognition software.  Despite best efforts to proofread,  errors can occur which can change the documentation meaning.    Rhae Hammock, PA-C 08/27/22 2030    Audley Hose, MD 08/28/22 [phone]

## 2022-08-27 NOTE — ED Notes (Signed)
Pt transported to CT ?

## 2022-09-01 DIAGNOSIS — Z419 Encounter for procedure for purposes other than remedying health state, unspecified: Secondary | ICD-10-CM | POA: Diagnosis not present

## 2022-09-02 ENCOUNTER — Telehealth: Payer: Self-pay

## 2022-09-02 ENCOUNTER — Inpatient Hospital Stay: Admission: RE | Admit: 2022-09-02 | Payer: 59 | Source: Ambulatory Visit

## 2022-09-02 NOTE — Telephone Encounter (Signed)
Doran Stabler, MD  Yevette Edwards, RN This patient needs a clinic appointment with me after recent ED visit.  Thank you  - HD

## 2022-09-02 NOTE — Telephone Encounter (Signed)
Left patient a detailed vm asking that he call the office at his convenience to schedule a hospital follow up with Dr. Loletha Carrow.

## 2022-09-04 NOTE — Telephone Encounter (Signed)
Left patient a detailed vm asking that he call the office at his convenience to schedule hospital follow up appt with Dr. Loletha Carrow.

## 2022-09-10 NOTE — Telephone Encounter (Signed)
Unable to reach patient. Letter has been mailed to patient.

## 2022-09-17 ENCOUNTER — Other Ambulatory Visit: Payer: Self-pay

## 2022-09-17 ENCOUNTER — Ambulatory Visit
Admission: RE | Admit: 2022-09-17 | Discharge: 2022-09-17 | Disposition: A | Discharge status: Home / Self Care | Payer: 59 | Source: Ambulatory Visit | Attending: Family Medicine | Admitting: Family Medicine

## 2022-09-17 DIAGNOSIS — M25511 Pain in right shoulder: Secondary | ICD-10-CM

## 2022-10-02 DIAGNOSIS — Z419 Encounter for procedure for purposes other than remedying health state, unspecified: Secondary | ICD-10-CM | POA: Diagnosis not present

## 2022-10-13 NOTE — Progress Notes (Unsigned)
Subjective:    Joel Pierce - 42 y.o. male MRN [id number]  Date of birth: Aug 13, 1981  HPI  Joel Pierce is to establish care.   Current issues and/or concerns: 09/11/2022 [place]   Per PA note: Assessment and Plan: 1. Encounter for physical examination - Reviewed age appropriate recommendations. Discussed recommendations for 150 minutes of exercise per week. Discussed healthy diet recommendations. -CBC and CMP up-to-date as of December 2023. No need to repeat at this time. -Ophthalmology referral placed for updated eye exam including diabetic eye exam. -Encouraged updating dental visits. -He will return for fasting labs for A1c and lipid profile. - Ambulatory Referral to Ophthalmology; Future - Hemoglobin A1C; Future - Lipid Profile; Future  2. Type 2 diabetes, diet controlled (HCC) -Checking A1c and urine albumin today. Not currently taking any medication. Discussed the importance of taking medications as directed. We will follow-up with recommendations for medication ending A1c results. -Foot exam performed including visual inspection, sensory exam using a monofilament, and pulse exam. - Ambulatory Referral to Ophthalmology; Future - Albumin, Urine  3. Essential hypertension -Discussed the importance of adequate blood pressure control, and risks of having hypertension. Patient states he has taken many blood pressure medicines, but he tolerated losartan well from what he remembers. He is agreeable to restarting losartan 50 mg once daily for blood pressure control and renal protection. We will follow-up in 1 month to assess tolerability. -Recommend checking blood pressure at least twice a day, and provided recommendations on how to accurately check blood pressure. Reviewed return and emergency precautions. - losartan (COZAAR) 50 MG tablet; Take 1 tablet (50 mg total) by mouth daily. Dispense: 90 tablet; Refill: 3  4.  Hepatic cirrhosis, unspecified hepatic cirrhosis type, unspecified whether ascites present ([place]) 5. Diverticulosis 6. Abdominal pain, unspecified abdominal location -No clear etiology on physical exam. Recommend patient contact gastroenterologist to set up follow-up visit, as he missed a visit with gastroenterology December 2023. -Discussed return precautions, including blood in the stool, black tarry stools, vomiting blood or coffee-ground emesis, or sudden very severe and sudden abdominal pain.  7. Acute pain of right shoulder -Discussed likely ligamentous or muscular etiology of pain, which x-ray would not diagnose, but x-ray is helpful in excluding bony abnormalities. Provided instructions on [facility] and told patient to arrive prior to 4 PM for walk-in appointment. Will follow-up on further recommendations pending results. - XR SHOULDER RT 2 OR MORE VIEWS Axillary; Future  Return for asap fasting labs/1 mo BP, diabetes, hld, shoulder pain f/u (50 min).  Patient Instructions Please go to [facility] for shoulder x-rays.[facility] takes walk-ins for x-ray through 4 pm. Their address is [address] phone [phone]. You can go tomorrow any time before 4 pm for this. Give them the order I gave you for your x-ray.  I sent in losartan blood pressure medicine. Please take this as prescribed. Also recommend checking your blood pressure at least twice a day, once in the morning and once at night. The best time to check your blood pressure is about an hour or an hour and a half after taking medicine. Please sit with your feet flat on the floor, slowly and not talking and breathing slowly while checking your blood pressure. Please follow-up with gastroenterology in regards to your abdominal pain, cirrhosis, and diverticulosis.  Please return for fasting blood work for lab only visit.  Please contact my office for worsening conditions or problems, and seek  emergency  medical treatment and/or call 911 if you or your family deems either necessary.  This document was created using the aid of voice recognition [employer].  Please contact my office for worsening conditions or problems, and seek emergency medical treatment and/or call 911 if you or your family deems either necessary.  Today's visit 10/14/2022:   ROS per HPI     Health Maintenance:  Health Maintenance Due  Topic Date Due   HIV Screening  Never done   DTaP/Tdap/Td (1 - Tdap) Never done   INFLUENZA VACCINE  Never done   COVID-19 Vaccine (2 - 2023-24 season) 05/02/2022     Past Medical History: There are no problems to display for this patient.     Social History   reports that he has never smoked. He has never used smokeless tobacco. He reports current alcohol use. He reports current drug use. Drug: Marijuana.   Family History  family history includes Heart disease in his mother; Kidney Stones in his mother; Kidney disease in his mother.   Medications: reviewed and updated   Objective:   Physical Exam There were no vitals taken for this visit. Physical Exam      Assessment & Plan:         Patient was given clear instructions to go to Emergency Department or return to medical center if symptoms don't improve, worsen, or new problems develop.The patient verbalized understanding.  I discussed the assessment and treatment plan with the patient. The patient was provided an opportunity to ask questions and all were answered. The patient agreed with the plan and demonstrated an understanding of the instructions.   The patient was advised to call back or seek an in-person evaluation if the symptoms worsen or if the condition fails to improve as anticipated.    Durene Fruits, NP 10/13/2022, 8:50 AM Primary Care at [facility]

## 2022-10-14 ENCOUNTER — Encounter: Payer: 59 | Admitting: Family

## 2022-10-14 DIAGNOSIS — Z7689 Persons encountering health services in other specified circumstances: Secondary | ICD-10-CM

## 2022-10-31 DIAGNOSIS — Z419 Encounter for procedure for purposes other than remedying health state, unspecified: Secondary | ICD-10-CM | POA: Diagnosis not present

## 2022-12-01 DIAGNOSIS — Z419 Encounter for procedure for purposes other than remedying health state, unspecified: Secondary | ICD-10-CM | POA: Diagnosis not present

## 2023-10-19 ENCOUNTER — Ambulatory Visit
Admission: EM | Admit: 2023-10-19 | Discharge: 2023-10-19 | Disposition: A | Discharge status: Home / Self Care | Payer: Medicaid Other
